# Patient Record
Sex: Female | Born: 1940 | Race: Black or African American | Marital: Married | Smoking: Current every day smoker
Health system: Southern US, Community
[De-identification: ages and names within clinical notes are randomized; demographics above are authoritative.]

## PROBLEM LIST (undated history)

## (undated) DIAGNOSIS — J439 Emphysema, unspecified: Secondary | ICD-10-CM

## (undated) HISTORY — PX: BACK SURGERY: SHX140

## (undated) HISTORY — PX: OTHER SURGICAL HISTORY: SHX169

## (undated) HISTORY — PX: TOTAL HIP ARTHROPLASTY: SHX124

## (undated) HISTORY — PX: ABDOMINAL HYSTERECTOMY: SHX81

## (undated) HISTORY — PX: SHOULDER SURGERY: SHX246

## (undated) HISTORY — PX: FOOT SURGERY: SHX648

## (undated) HISTORY — DX: Emphysema, unspecified: J43.9

---

## 2016-10-20 DIAGNOSIS — G894 Chronic pain syndrome: Secondary | ICD-10-CM | POA: Diagnosis not present

## 2016-10-20 DIAGNOSIS — M961 Postlaminectomy syndrome, not elsewhere classified: Secondary | ICD-10-CM | POA: Diagnosis not present

## 2016-10-20 DIAGNOSIS — M47817 Spondylosis without myelopathy or radiculopathy, lumbosacral region: Secondary | ICD-10-CM | POA: Diagnosis not present

## 2016-10-20 DIAGNOSIS — M79651 Pain in right thigh: Secondary | ICD-10-CM | POA: Diagnosis not present

## 2016-10-20 DIAGNOSIS — Z79899 Other long term (current) drug therapy: Secondary | ICD-10-CM | POA: Diagnosis not present

## 2016-10-20 DIAGNOSIS — Z79891 Long term (current) use of opiate analgesic: Secondary | ICD-10-CM | POA: Diagnosis not present

## 2016-11-24 DIAGNOSIS — Z79899 Other long term (current) drug therapy: Secondary | ICD-10-CM | POA: Diagnosis not present

## 2016-11-24 DIAGNOSIS — M79651 Pain in right thigh: Secondary | ICD-10-CM | POA: Diagnosis not present

## 2016-11-24 DIAGNOSIS — M961 Postlaminectomy syndrome, not elsewhere classified: Secondary | ICD-10-CM | POA: Diagnosis not present

## 2016-11-24 DIAGNOSIS — M47817 Spondylosis without myelopathy or radiculopathy, lumbosacral region: Secondary | ICD-10-CM | POA: Diagnosis not present

## 2016-11-24 DIAGNOSIS — G894 Chronic pain syndrome: Secondary | ICD-10-CM | POA: Diagnosis not present

## 2016-11-24 DIAGNOSIS — Z79891 Long term (current) use of opiate analgesic: Secondary | ICD-10-CM | POA: Diagnosis not present

## 2016-12-22 DIAGNOSIS — M961 Postlaminectomy syndrome, not elsewhere classified: Secondary | ICD-10-CM | POA: Diagnosis not present

## 2016-12-22 DIAGNOSIS — Z79899 Other long term (current) drug therapy: Secondary | ICD-10-CM | POA: Diagnosis not present

## 2016-12-22 DIAGNOSIS — G894 Chronic pain syndrome: Secondary | ICD-10-CM | POA: Diagnosis not present

## 2016-12-22 DIAGNOSIS — M545 Low back pain: Secondary | ICD-10-CM | POA: Diagnosis not present

## 2016-12-22 DIAGNOSIS — M47817 Spondylosis without myelopathy or radiculopathy, lumbosacral region: Secondary | ICD-10-CM | POA: Diagnosis not present

## 2016-12-22 DIAGNOSIS — Z79891 Long term (current) use of opiate analgesic: Secondary | ICD-10-CM | POA: Diagnosis not present

## 2016-12-30 DIAGNOSIS — M5136 Other intervertebral disc degeneration, lumbar region: Secondary | ICD-10-CM | POA: Diagnosis not present

## 2016-12-30 DIAGNOSIS — M545 Low back pain: Secondary | ICD-10-CM | POA: Diagnosis not present

## 2016-12-30 DIAGNOSIS — M961 Postlaminectomy syndrome, not elsewhere classified: Secondary | ICD-10-CM | POA: Diagnosis not present

## 2016-12-30 DIAGNOSIS — M47817 Spondylosis without myelopathy or radiculopathy, lumbosacral region: Secondary | ICD-10-CM | POA: Diagnosis not present

## 2017-01-05 DIAGNOSIS — M5136 Other intervertebral disc degeneration, lumbar region: Secondary | ICD-10-CM | POA: Diagnosis not present

## 2017-01-05 DIAGNOSIS — F5101 Primary insomnia: Secondary | ICD-10-CM | POA: Diagnosis not present

## 2017-01-05 DIAGNOSIS — G894 Chronic pain syndrome: Secondary | ICD-10-CM | POA: Diagnosis not present

## 2017-01-05 DIAGNOSIS — R1013 Epigastric pain: Secondary | ICD-10-CM | POA: Diagnosis not present

## 2017-01-05 DIAGNOSIS — Z720 Tobacco use: Secondary | ICD-10-CM | POA: Diagnosis not present

## 2017-01-18 DIAGNOSIS — M47817 Spondylosis without myelopathy or radiculopathy, lumbosacral region: Secondary | ICD-10-CM | POA: Diagnosis not present

## 2017-01-18 DIAGNOSIS — Z79899 Other long term (current) drug therapy: Secondary | ICD-10-CM | POA: Diagnosis not present

## 2017-01-18 DIAGNOSIS — Z79891 Long term (current) use of opiate analgesic: Secondary | ICD-10-CM | POA: Diagnosis not present

## 2017-01-18 DIAGNOSIS — M961 Postlaminectomy syndrome, not elsewhere classified: Secondary | ICD-10-CM | POA: Diagnosis not present

## 2017-01-18 DIAGNOSIS — G894 Chronic pain syndrome: Secondary | ICD-10-CM | POA: Diagnosis not present

## 2017-01-18 DIAGNOSIS — M79651 Pain in right thigh: Secondary | ICD-10-CM | POA: Diagnosis not present

## 2017-02-15 DIAGNOSIS — G894 Chronic pain syndrome: Secondary | ICD-10-CM | POA: Diagnosis not present

## 2017-02-15 DIAGNOSIS — M47817 Spondylosis without myelopathy or radiculopathy, lumbosacral region: Secondary | ICD-10-CM | POA: Diagnosis not present

## 2017-02-15 DIAGNOSIS — M961 Postlaminectomy syndrome, not elsewhere classified: Secondary | ICD-10-CM | POA: Diagnosis not present

## 2017-02-15 DIAGNOSIS — M79651 Pain in right thigh: Secondary | ICD-10-CM | POA: Diagnosis not present

## 2017-03-15 DIAGNOSIS — Z79891 Long term (current) use of opiate analgesic: Secondary | ICD-10-CM | POA: Diagnosis not present

## 2017-03-15 DIAGNOSIS — Z79899 Other long term (current) drug therapy: Secondary | ICD-10-CM | POA: Diagnosis not present

## 2017-03-15 DIAGNOSIS — M79651 Pain in right thigh: Secondary | ICD-10-CM | POA: Diagnosis not present

## 2017-03-15 DIAGNOSIS — M961 Postlaminectomy syndrome, not elsewhere classified: Secondary | ICD-10-CM | POA: Diagnosis not present

## 2017-03-15 DIAGNOSIS — G894 Chronic pain syndrome: Secondary | ICD-10-CM | POA: Diagnosis not present

## 2017-03-15 DIAGNOSIS — M47817 Spondylosis without myelopathy or radiculopathy, lumbosacral region: Secondary | ICD-10-CM | POA: Diagnosis not present

## 2017-04-05 DIAGNOSIS — H40003 Preglaucoma, unspecified, bilateral: Secondary | ICD-10-CM | POA: Diagnosis not present

## 2017-04-05 DIAGNOSIS — M545 Low back pain: Secondary | ICD-10-CM | POA: Diagnosis not present

## 2017-04-05 DIAGNOSIS — H527 Unspecified disorder of refraction: Secondary | ICD-10-CM | POA: Diagnosis not present

## 2017-04-05 DIAGNOSIS — H25813 Combined forms of age-related cataract, bilateral: Secondary | ICD-10-CM | POA: Diagnosis not present

## 2017-04-05 DIAGNOSIS — H02831 Dermatochalasis of right upper eyelid: Secondary | ICD-10-CM | POA: Diagnosis not present

## 2017-04-05 DIAGNOSIS — M5136 Other intervertebral disc degeneration, lumbar region: Secondary | ICD-10-CM | POA: Diagnosis not present

## 2017-04-05 DIAGNOSIS — M961 Postlaminectomy syndrome, not elsewhere classified: Secondary | ICD-10-CM | POA: Diagnosis not present

## 2017-04-05 DIAGNOSIS — H02834 Dermatochalasis of left upper eyelid: Secondary | ICD-10-CM | POA: Diagnosis not present

## 2017-04-05 DIAGNOSIS — M47817 Spondylosis without myelopathy or radiculopathy, lumbosacral region: Secondary | ICD-10-CM | POA: Diagnosis not present

## 2017-04-06 DIAGNOSIS — Z79899 Other long term (current) drug therapy: Secondary | ICD-10-CM | POA: Diagnosis not present

## 2017-04-06 DIAGNOSIS — E7800 Pure hypercholesterolemia, unspecified: Secondary | ICD-10-CM | POA: Diagnosis not present

## 2017-04-06 DIAGNOSIS — G894 Chronic pain syndrome: Secondary | ICD-10-CM | POA: Diagnosis not present

## 2017-04-06 DIAGNOSIS — Z0001 Encounter for general adult medical examination with abnormal findings: Secondary | ICD-10-CM | POA: Diagnosis not present

## 2017-04-06 DIAGNOSIS — M5136 Other intervertebral disc degeneration, lumbar region: Secondary | ICD-10-CM | POA: Diagnosis not present

## 2017-04-06 DIAGNOSIS — F5101 Primary insomnia: Secondary | ICD-10-CM | POA: Diagnosis not present

## 2017-04-06 DIAGNOSIS — Z720 Tobacco use: Secondary | ICD-10-CM | POA: Diagnosis not present

## 2017-04-12 ENCOUNTER — Other Ambulatory Visit: Payer: Self-pay | Admitting: Acute Care

## 2017-04-12 DIAGNOSIS — F17210 Nicotine dependence, cigarettes, uncomplicated: Secondary | ICD-10-CM

## 2017-04-14 ENCOUNTER — Ambulatory Visit (INDEPENDENT_AMBULATORY_CARE_PROVIDER_SITE_OTHER): Payer: Medicare Other | Admitting: Acute Care

## 2017-04-14 ENCOUNTER — Ambulatory Visit (INDEPENDENT_AMBULATORY_CARE_PROVIDER_SITE_OTHER)
Admission: RE | Admit: 2017-04-14 | Discharge: 2017-04-14 | Disposition: A | Discharge status: Home / Self Care | Payer: Medicare Other | Source: Ambulatory Visit | Attending: Acute Care | Admitting: Acute Care

## 2017-04-14 ENCOUNTER — Encounter: Payer: Self-pay | Admitting: Acute Care

## 2017-04-14 DIAGNOSIS — F17210 Nicotine dependence, cigarettes, uncomplicated: Secondary | ICD-10-CM

## 2017-04-14 DIAGNOSIS — Z87891 Personal history of nicotine dependence: Secondary | ICD-10-CM

## 2017-04-14 NOTE — Progress Notes (Signed)
Shared Decision Making Visit Lung Cancer Screening Program [phone])   Eligibility:  Age 76 y.o.  Pack Years Smoking History Calculation 37 pack year smoking history (# packs/per year x # years smoked)  Recent History of coughing up blood  no  Unexplained weight loss? no ( >Than 15 pounds within the last 6 months )  Prior History Lung / other cancer no (Diagnosis within the last 5 years already requiring surveillance chest CT Scans).  Smoking Status Current Smoker  Former Smokers: Years since quit: NA  Quit Date: NA  Visit Components:  Discussion included one or more decision making aids. yes  Discussion included risk/benefits of screening. yes  Discussion included potential follow up diagnostic testing for abnormal scans. yes  Discussion included meaning and risk of over diagnosis. yes  Discussion included meaning and risk of False Positives. yes  Discussion included meaning of total radiation exposure. yes  Counseling Included:  Importance of adherence to annual lung cancer LDCT screening. yes  Impact of comorbidities on ability to participate in the program. yes  Ability and willingness to under diagnostic treatment. yes  Smoking Cessation Counseling:  Current Smokers:   Discussed importance of smoking cessation. yes  Information about tobacco cessation classes and interventions provided to patient. yes  Patient provided with "ticket" for LDCT Scan. yes  Symptomatic Patient. no  Counseling  Diagnosis Code: Tobacco Use Z72.0  Asymptomatic Patient yes  Counseling (Intermediate counseling: > three minutes counseling) D4081  Former Smokers:   Discussed the importance of maintaining cigarette abstinence. yes  Diagnosis Code: Personal History of Nicotine Dependence. K48.185  Information about tobacco cessation classes and interventions provided to patient. Yes  Patient provided with "ticket" for LDCT Scan. yes  Written Order for Lung Cancer  Screening with LDCT placed in Epic. Yes (CT Chest Lung Cancer Screening Low Dose W/O CM) [id number] Z12.2-Screening of respiratory organs Z87.891-Personal history of nicotine dependence  I have spent 25 minutes of face to face time with Danielle Wright discussing the risks and benefits of lung cancer screening. We viewed a power point together that explained in detail the above noted topics. We paused at intervals to allow for questions to be asked and answered to ensure understanding.We discussed that the single most powerful action that she can take to decrease her risk of developing lung cancer is to quit smoking. We discussed whether or not she is ready to commit to setting a quit date. She is currently not ready to set a quit date, however is very interested in working on quitting. We discussed options for tools to aid in quitting smoking including nicotine replacement therapy, non-nicotine medications, support groups, Quit Smart classes, and behavior modification. We discussed that often times setting smaller, more achievable goals, such as eliminating 1 cigarette a day for a week and then 2 cigarettes a day for a week can be helpful in slowly decreasing the number of cigarettes smoked. This allows for a sense of accomplishment as well as providing a clinical benefit. I gave her the " Be Stronger Than Your Excuses" card with contact information for community resources, classes, free nicotine replacement therapy, and access to mobile apps, text messaging, and on-line smoking cessation help. I have also given Danielle Wright my card and contact information in the event she needs to contact me. We discussed the time and location of the scan, and that either Doroteo Glassman RN or I will call with the results within 24-48 hours of receiving them. I have offered her  a copy of the power point we viewed  as a resource in the event they need reinforcement of the concepts we discussed today in the office. The patient  verbalized understanding of all of  the above and had no further questions upon leaving the office. They have my contact information in the event they have any further questions.  I spent 4 minutes counseling on smoking cessation and the health risks of continued tobacco abuse.  I explained to the patient that there has been a high incidence of coronary artery disease noted on these exams. I explained that this is a non-gated exam therefore degree or severity cannot be determined. This patient is on statin therapy. I have asked the patient to follow-up with their PCP regarding any incidental finding of coronary artery disease and management with diet or medication as their PCP  feels is clinically indicated. The patient verbalized understanding of the above and had no further questions upon completion of the visit.     Magdalen Spatz, NP 04/14/2017

## 2017-04-15 DIAGNOSIS — Z79899 Other long term (current) drug therapy: Secondary | ICD-10-CM | POA: Diagnosis not present

## 2017-04-15 DIAGNOSIS — M79651 Pain in right thigh: Secondary | ICD-10-CM | POA: Diagnosis not present

## 2017-04-15 DIAGNOSIS — M47817 Spondylosis without myelopathy or radiculopathy, lumbosacral region: Secondary | ICD-10-CM | POA: Diagnosis not present

## 2017-04-15 DIAGNOSIS — G894 Chronic pain syndrome: Secondary | ICD-10-CM | POA: Diagnosis not present

## 2017-04-15 DIAGNOSIS — M961 Postlaminectomy syndrome, not elsewhere classified: Secondary | ICD-10-CM | POA: Diagnosis not present

## 2017-04-15 DIAGNOSIS — Z79891 Long term (current) use of opiate analgesic: Secondary | ICD-10-CM | POA: Diagnosis not present

## 2017-04-19 ENCOUNTER — Other Ambulatory Visit: Payer: Self-pay | Admitting: Acute Care

## 2017-04-19 DIAGNOSIS — F17210 Nicotine dependence, cigarettes, uncomplicated: Secondary | ICD-10-CM

## 2017-04-20 ENCOUNTER — Other Ambulatory Visit: Payer: Self-pay | Admitting: Acute Care

## 2017-04-20 DIAGNOSIS — F17210 Nicotine dependence, cigarettes, uncomplicated: Secondary | ICD-10-CM

## 2017-05-13 DIAGNOSIS — M47817 Spondylosis without myelopathy or radiculopathy, lumbosacral region: Secondary | ICD-10-CM | POA: Diagnosis not present

## 2017-05-13 DIAGNOSIS — M79651 Pain in right thigh: Secondary | ICD-10-CM | POA: Diagnosis not present

## 2017-05-13 DIAGNOSIS — M961 Postlaminectomy syndrome, not elsewhere classified: Secondary | ICD-10-CM | POA: Diagnosis not present

## 2017-05-13 DIAGNOSIS — G894 Chronic pain syndrome: Secondary | ICD-10-CM | POA: Diagnosis not present

## 2017-05-24 ENCOUNTER — Other Ambulatory Visit: Payer: Self-pay | Admitting: Internal Medicine

## 2017-05-24 ENCOUNTER — Other Ambulatory Visit: Payer: Self-pay | Admitting: Family Medicine

## 2017-05-24 DIAGNOSIS — Z1231 Encounter for screening mammogram for malignant neoplasm of breast: Secondary | ICD-10-CM

## 2017-06-09 ENCOUNTER — Ambulatory Visit
Admission: RE | Admit: 2017-06-09 | Discharge: 2017-06-09 | Disposition: A | Discharge status: Home / Self Care | Payer: Medicare Other | Source: Ambulatory Visit | Attending: Family Medicine | Admitting: Family Medicine

## 2017-06-09 DIAGNOSIS — Z1231 Encounter for screening mammogram for malignant neoplasm of breast: Secondary | ICD-10-CM | POA: Diagnosis not present

## 2017-06-10 DIAGNOSIS — Z79899 Other long term (current) drug therapy: Secondary | ICD-10-CM | POA: Diagnosis not present

## 2017-06-10 DIAGNOSIS — G894 Chronic pain syndrome: Secondary | ICD-10-CM | POA: Diagnosis not present

## 2017-06-10 DIAGNOSIS — M961 Postlaminectomy syndrome, not elsewhere classified: Secondary | ICD-10-CM | POA: Diagnosis not present

## 2017-06-10 DIAGNOSIS — M47817 Spondylosis without myelopathy or radiculopathy, lumbosacral region: Secondary | ICD-10-CM | POA: Diagnosis not present

## 2017-06-10 DIAGNOSIS — M79651 Pain in right thigh: Secondary | ICD-10-CM | POA: Diagnosis not present

## 2017-06-10 DIAGNOSIS — Z79891 Long term (current) use of opiate analgesic: Secondary | ICD-10-CM | POA: Diagnosis not present

## 2017-07-05 DIAGNOSIS — H40003 Preglaucoma, unspecified, bilateral: Secondary | ICD-10-CM | POA: Diagnosis not present

## 2017-07-06 DIAGNOSIS — G894 Chronic pain syndrome: Secondary | ICD-10-CM | POA: Diagnosis not present

## 2017-07-06 DIAGNOSIS — M79651 Pain in right thigh: Secondary | ICD-10-CM | POA: Diagnosis not present

## 2017-07-06 DIAGNOSIS — Z79891 Long term (current) use of opiate analgesic: Secondary | ICD-10-CM | POA: Diagnosis not present

## 2017-07-06 DIAGNOSIS — Z79899 Other long term (current) drug therapy: Secondary | ICD-10-CM | POA: Diagnosis not present

## 2017-07-06 DIAGNOSIS — M47817 Spondylosis without myelopathy or radiculopathy, lumbosacral region: Secondary | ICD-10-CM | POA: Diagnosis not present

## 2017-07-06 DIAGNOSIS — M961 Postlaminectomy syndrome, not elsewhere classified: Secondary | ICD-10-CM | POA: Diagnosis not present

## 2017-07-13 DIAGNOSIS — M5136 Other intervertebral disc degeneration, lumbar region: Secondary | ICD-10-CM | POA: Diagnosis not present

## 2017-07-13 DIAGNOSIS — M961 Postlaminectomy syndrome, not elsewhere classified: Secondary | ICD-10-CM | POA: Diagnosis not present

## 2017-07-13 DIAGNOSIS — M47817 Spondylosis without myelopathy or radiculopathy, lumbosacral region: Secondary | ICD-10-CM | POA: Diagnosis not present

## 2017-08-03 DIAGNOSIS — M79651 Pain in right thigh: Secondary | ICD-10-CM | POA: Diagnosis not present

## 2017-08-03 DIAGNOSIS — M961 Postlaminectomy syndrome, not elsewhere classified: Secondary | ICD-10-CM | POA: Diagnosis not present

## 2017-08-03 DIAGNOSIS — M47817 Spondylosis without myelopathy or radiculopathy, lumbosacral region: Secondary | ICD-10-CM | POA: Diagnosis not present

## 2017-08-03 DIAGNOSIS — G894 Chronic pain syndrome: Secondary | ICD-10-CM | POA: Diagnosis not present

## 2017-08-17 DIAGNOSIS — M5136 Other intervertebral disc degeneration, lumbar region: Secondary | ICD-10-CM | POA: Diagnosis not present

## 2017-08-17 DIAGNOSIS — M961 Postlaminectomy syndrome, not elsewhere classified: Secondary | ICD-10-CM | POA: Diagnosis not present

## 2017-08-17 DIAGNOSIS — M47817 Spondylosis without myelopathy or radiculopathy, lumbosacral region: Secondary | ICD-10-CM | POA: Diagnosis not present

## 2017-08-17 DIAGNOSIS — G894 Chronic pain syndrome: Secondary | ICD-10-CM | POA: Diagnosis not present

## 2017-08-31 ENCOUNTER — Ambulatory Visit (INDEPENDENT_AMBULATORY_CARE_PROVIDER_SITE_OTHER): Payer: Medicare Other

## 2017-08-31 DIAGNOSIS — J439 Emphysema, unspecified: Secondary | ICD-10-CM | POA: Diagnosis not present

## 2017-08-31 DIAGNOSIS — F17210 Nicotine dependence, cigarettes, uncomplicated: Secondary | ICD-10-CM

## 2017-08-31 DIAGNOSIS — Z122 Encounter for screening for malignant neoplasm of respiratory organs: Secondary | ICD-10-CM | POA: Diagnosis not present

## 2017-09-03 DIAGNOSIS — M47817 Spondylosis without myelopathy or radiculopathy, lumbosacral region: Secondary | ICD-10-CM | POA: Diagnosis not present

## 2017-09-03 DIAGNOSIS — M961 Postlaminectomy syndrome, not elsewhere classified: Secondary | ICD-10-CM | POA: Diagnosis not present

## 2017-09-03 DIAGNOSIS — Z79891 Long term (current) use of opiate analgesic: Secondary | ICD-10-CM | POA: Diagnosis not present

## 2017-09-03 DIAGNOSIS — G894 Chronic pain syndrome: Secondary | ICD-10-CM | POA: Diagnosis not present

## 2017-09-03 DIAGNOSIS — M79651 Pain in right thigh: Secondary | ICD-10-CM | POA: Diagnosis not present

## 2017-09-03 DIAGNOSIS — Z79899 Other long term (current) drug therapy: Secondary | ICD-10-CM | POA: Diagnosis not present

## 2017-09-06 ENCOUNTER — Telehealth: Payer: Self-pay | Admitting: Acute Care

## 2017-09-06 DIAGNOSIS — F17210 Nicotine dependence, cigarettes, uncomplicated: Secondary | ICD-10-CM

## 2017-09-06 DIAGNOSIS — Z122 Encounter for screening for malignant neoplasm of respiratory organs: Secondary | ICD-10-CM

## 2017-09-06 DIAGNOSIS — H40003 Preglaucoma, unspecified, bilateral: Secondary | ICD-10-CM | POA: Diagnosis not present

## 2017-09-06 NOTE — Telephone Encounter (Signed)
Pt informed of CT results per Sarah Groce, NP.  PT verbalized understanding.  Copy sent to PCP.  Order placed for 1 yr f/u CT.  

## 2017-10-07 DIAGNOSIS — G894 Chronic pain syndrome: Secondary | ICD-10-CM | POA: Diagnosis not present

## 2017-10-07 DIAGNOSIS — M47817 Spondylosis without myelopathy or radiculopathy, lumbosacral region: Secondary | ICD-10-CM | POA: Diagnosis not present

## 2017-10-07 DIAGNOSIS — M961 Postlaminectomy syndrome, not elsewhere classified: Secondary | ICD-10-CM | POA: Diagnosis not present

## 2017-10-07 DIAGNOSIS — M79651 Pain in right thigh: Secondary | ICD-10-CM | POA: Diagnosis not present

## 2017-10-11 DIAGNOSIS — E7800 Pure hypercholesterolemia, unspecified: Secondary | ICD-10-CM | POA: Diagnosis not present

## 2017-10-11 DIAGNOSIS — Z79890 Hormone replacement therapy (postmenopausal): Secondary | ICD-10-CM | POA: Diagnosis not present

## 2017-10-11 DIAGNOSIS — I10 Essential (primary) hypertension: Secondary | ICD-10-CM | POA: Diagnosis not present

## 2017-10-11 DIAGNOSIS — Z79899 Other long term (current) drug therapy: Secondary | ICD-10-CM | POA: Diagnosis not present

## 2017-11-03 DIAGNOSIS — Z79891 Long term (current) use of opiate analgesic: Secondary | ICD-10-CM | POA: Diagnosis not present

## 2017-11-03 DIAGNOSIS — M79651 Pain in right thigh: Secondary | ICD-10-CM | POA: Diagnosis not present

## 2017-11-03 DIAGNOSIS — M961 Postlaminectomy syndrome, not elsewhere classified: Secondary | ICD-10-CM | POA: Diagnosis not present

## 2017-11-03 DIAGNOSIS — Z79899 Other long term (current) drug therapy: Secondary | ICD-10-CM | POA: Diagnosis not present

## 2017-11-03 DIAGNOSIS — G894 Chronic pain syndrome: Secondary | ICD-10-CM | POA: Diagnosis not present

## 2017-11-03 DIAGNOSIS — M47817 Spondylosis without myelopathy or radiculopathy, lumbosacral region: Secondary | ICD-10-CM | POA: Diagnosis not present

## 2017-11-11 ENCOUNTER — Ambulatory Visit (INDEPENDENT_AMBULATORY_CARE_PROVIDER_SITE_OTHER): Payer: Medicare Other | Admitting: Obstetrics and Gynecology

## 2017-11-11 ENCOUNTER — Encounter: Payer: Self-pay | Admitting: Obstetrics and Gynecology

## 2017-11-11 VITALS — BP 126/71 | HR 91 | Ht 64.0 in | Wt 143.0 lb

## 2017-11-11 DIAGNOSIS — R232 Flushing: Secondary | ICD-10-CM | POA: Diagnosis not present

## 2017-11-11 DIAGNOSIS — L74513 Primary focal hyperhidrosis, soles: Secondary | ICD-10-CM | POA: Diagnosis not present

## 2017-11-11 DIAGNOSIS — R61 Generalized hyperhidrosis: Secondary | ICD-10-CM | POA: Diagnosis not present

## 2017-11-11 NOTE — Addendum Note (Signed)
Addended by: Lyndal Rainbow on: 11/11/2017 02:26 PM   Modules accepted: Orders

## 2017-11-11 NOTE — Progress Notes (Signed)
77 yo P3 here to establish care. Patient referred from PCP due to patient being on HRT. Patient states that she has been postmenopausal for over 20 years. She denies any vaginal bleeding or pelvic pain. She was started on HRT at least 6 years ago. She reports symptoms are well controlled but she still experiences some night sweats. Patien tis currently without any complaints. Patient is not sexually active and denies any leakage of urine  Past Medical History:  Diagnosis Date  . Emphysema of lung [facility])    Past Surgical History:  Procedure Laterality Date  . ABDOMINAL HYSTERECTOMY    . BACK SURGERY    . FOOT SURGERY Right   . SHOULDER SURGERY Right   . thumb surgery Right   . TOTAL HIP ARTHROPLASTY     Family History  Problem Relation Age of Onset  . Heart attack Father   . Stroke Mother   . Breast cancer Neg Hx    Social History   Tobacco Use  . Smoking status: Current Every Day Smoker    Packs/day: 0.75    Years: 50.00    Pack years: 37.50    Types: Cigarettes  . Smokeless tobacco: Never Used  Substance Use Topics  . Alcohol use: No    Frequency: Never  . Drug use: No   ROS See pertinent in HPI  Blood pressure 126/71, pulse 91, height 5\' 4"  (1.626 m), weight 143 lb (64.9 kg). GENERAL: Well-developed, well-nourished female in no acute distress.  ABDOMEN: Soft, nontender, nondistended. No organomegaly. PELVIC: Not indicated EXTREMITIES: No cyanosis, clubbing, or edema, 2+ distal pulses.  A/P 77 yo postmenopausal on HRT - Discussed the goal of HRT is to alleviate the symptoms while being on the lowest dose for the shortest amount of time - Discussed gradually tapering off from estradiol - Will check TSH today to rule out any other etiology of night sweats - Patient with a normal mammogram in 2018 - RTC prn - Follow up with PCP for routine care and annual exam

## 2017-11-12 LAB — TSH: TSH: 2.69 mIU/L (ref 0.40–4.50)

## 2017-12-01 DIAGNOSIS — M961 Postlaminectomy syndrome, not elsewhere classified: Secondary | ICD-10-CM | POA: Diagnosis not present

## 2017-12-01 DIAGNOSIS — Z79891 Long term (current) use of opiate analgesic: Secondary | ICD-10-CM | POA: Diagnosis not present

## 2017-12-01 DIAGNOSIS — M47817 Spondylosis without myelopathy or radiculopathy, lumbosacral region: Secondary | ICD-10-CM | POA: Diagnosis not present

## 2017-12-01 DIAGNOSIS — Z79899 Other long term (current) drug therapy: Secondary | ICD-10-CM | POA: Diagnosis not present

## 2017-12-01 DIAGNOSIS — M79651 Pain in right thigh: Secondary | ICD-10-CM | POA: Diagnosis not present

## 2017-12-01 DIAGNOSIS — G894 Chronic pain syndrome: Secondary | ICD-10-CM | POA: Diagnosis not present

## 2017-12-15 DIAGNOSIS — M47896 Other spondylosis, lumbar region: Secondary | ICD-10-CM | POA: Diagnosis not present

## 2017-12-15 DIAGNOSIS — M545 Low back pain: Secondary | ICD-10-CM | POA: Diagnosis not present

## 2017-12-15 DIAGNOSIS — M5136 Other intervertebral disc degeneration, lumbar region: Secondary | ICD-10-CM | POA: Diagnosis not present

## 2017-12-15 DIAGNOSIS — M5137 Other intervertebral disc degeneration, lumbosacral region: Secondary | ICD-10-CM | POA: Diagnosis not present

## 2017-12-15 DIAGNOSIS — M47897 Other spondylosis, lumbosacral region: Secondary | ICD-10-CM | POA: Diagnosis not present

## 2017-12-24 DIAGNOSIS — M25551 Pain in right hip: Secondary | ICD-10-CM | POA: Diagnosis not present

## 2017-12-29 DIAGNOSIS — Z79891 Long term (current) use of opiate analgesic: Secondary | ICD-10-CM | POA: Diagnosis not present

## 2017-12-29 DIAGNOSIS — Z79899 Other long term (current) drug therapy: Secondary | ICD-10-CM | POA: Diagnosis not present

## 2017-12-29 DIAGNOSIS — M25551 Pain in right hip: Secondary | ICD-10-CM | POA: Diagnosis not present

## 2017-12-29 DIAGNOSIS — G894 Chronic pain syndrome: Secondary | ICD-10-CM | POA: Diagnosis not present

## 2017-12-29 DIAGNOSIS — M961 Postlaminectomy syndrome, not elsewhere classified: Secondary | ICD-10-CM | POA: Diagnosis not present

## 2018-01-03 DIAGNOSIS — G894 Chronic pain syndrome: Secondary | ICD-10-CM | POA: Diagnosis not present

## 2018-01-03 DIAGNOSIS — M25559 Pain in unspecified hip: Secondary | ICD-10-CM | POA: Diagnosis not present

## 2018-01-03 DIAGNOSIS — M79651 Pain in right thigh: Secondary | ICD-10-CM | POA: Diagnosis not present

## 2018-01-11 IMAGING — CT CT CHEST LCS NODULE FOLLOW-UP W/O CM
2 of 3 series · 15 of 36 positions shown, 18 images · non-contrast
Comparison: 04/14/2017

CLINICAL DATA: Lung cancer screening. Current asymptomatic smoker.
Thirty-seven pack year history.

EXAM:
CT CHEST WITHOUT CONTRAST FOR LUNG CANCER SCREENING NODULE FOLLOW-UP
TECHNIQUE: Multidetector CT imaging of the chest was performed following the
standard protocol without IV contrast.

[Series 2: axial st · axial · 0.67mm/px · z∈[-233,+12]mm · 12 of 59 slices shown, 15 images]
[im 5/59  mediastinal]
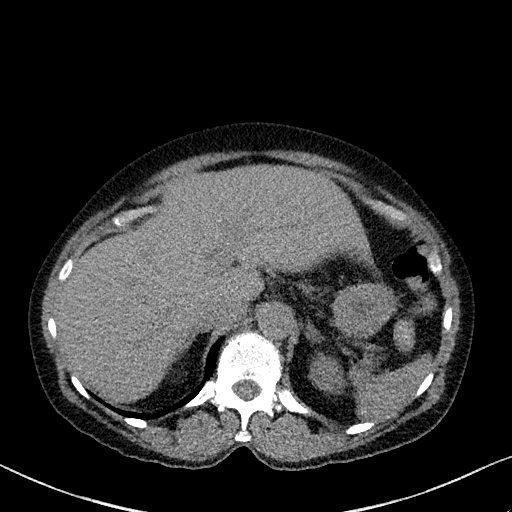
[im 5/59  lung]
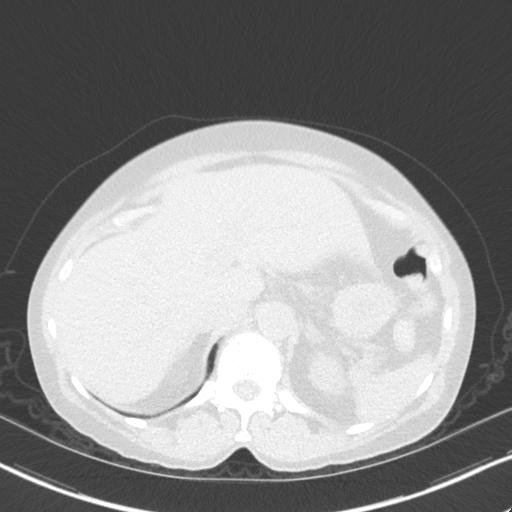
[im 9/59  lung]
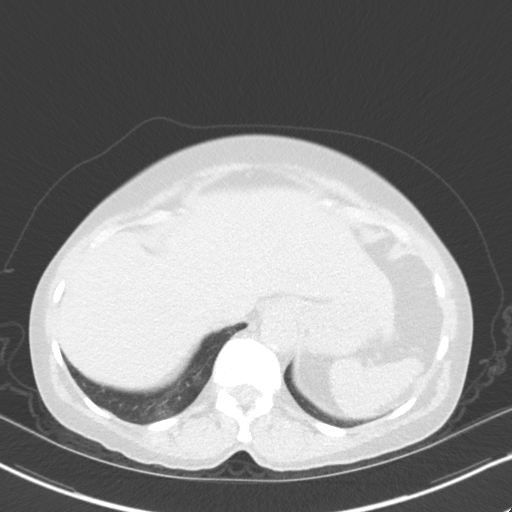
[im 13/59  lung]
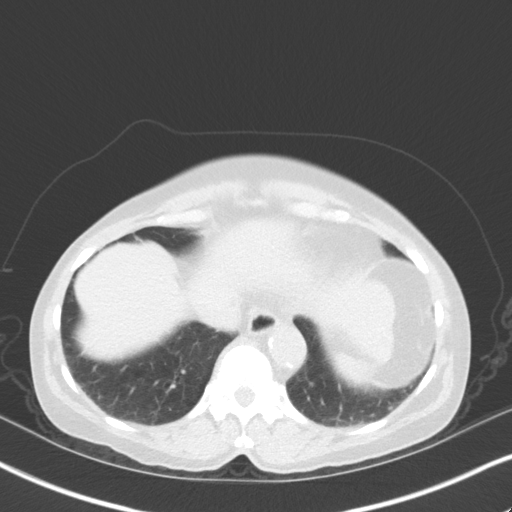
[im 18/59  lung]
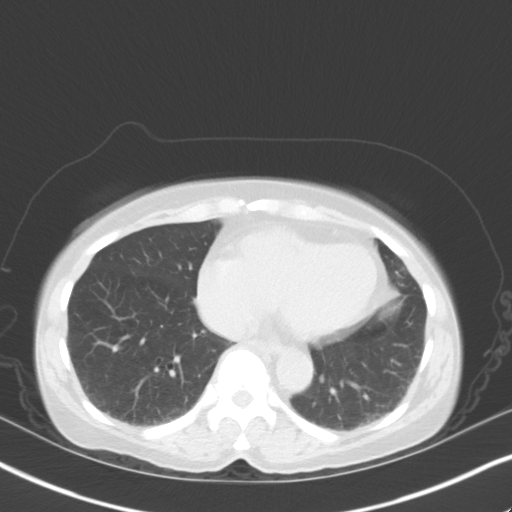
[im 22/59  mediastinal]
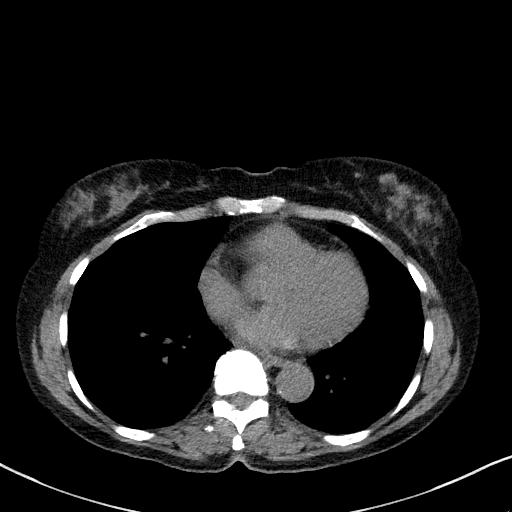
[im 22/59  lung]
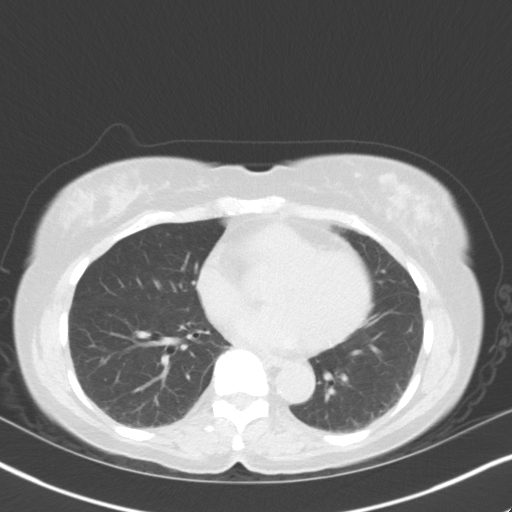
[im 26/59  lung]
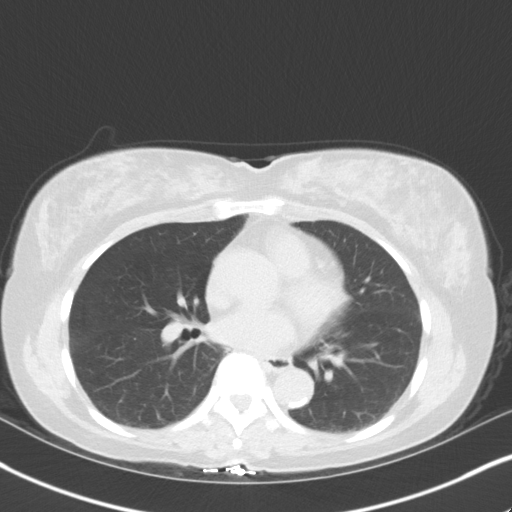
[im 33/59  lung]
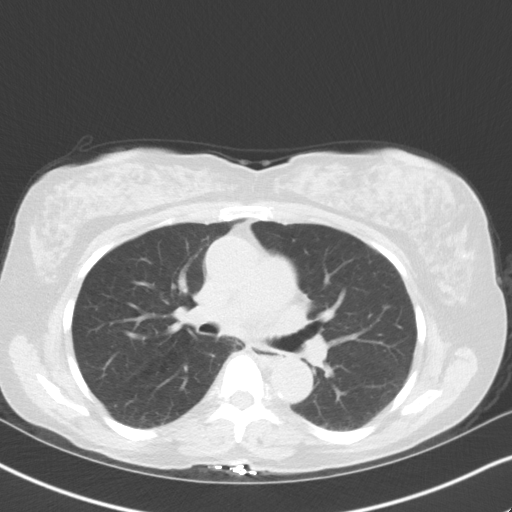
[im 37/59  lung]
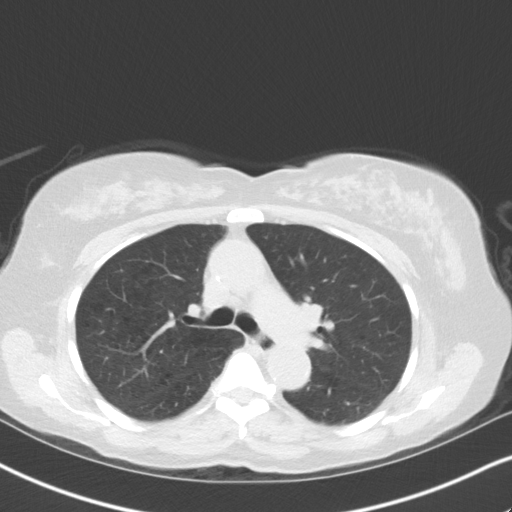
[im 41/59  mediastinal]
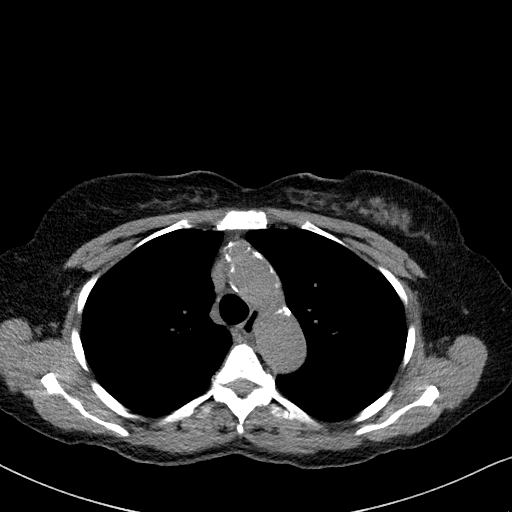
[im 41/59  lung]
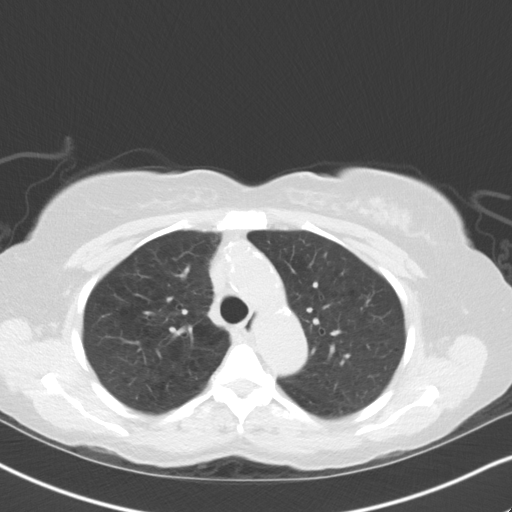
[im 46/59  lung]
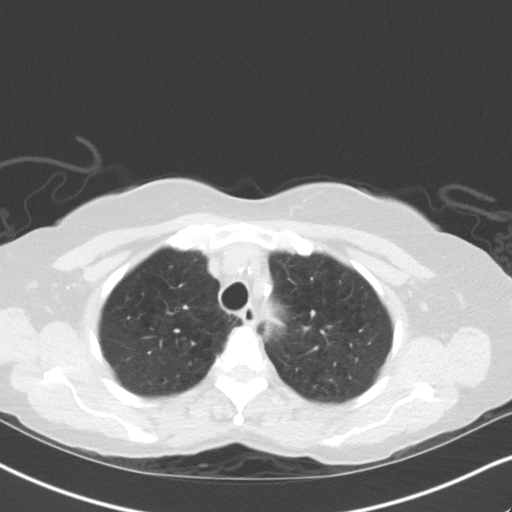
[im 50/59  lung]
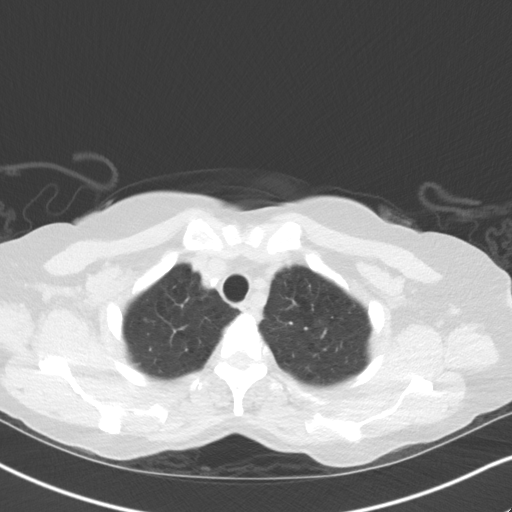
[im 54/59  lung]
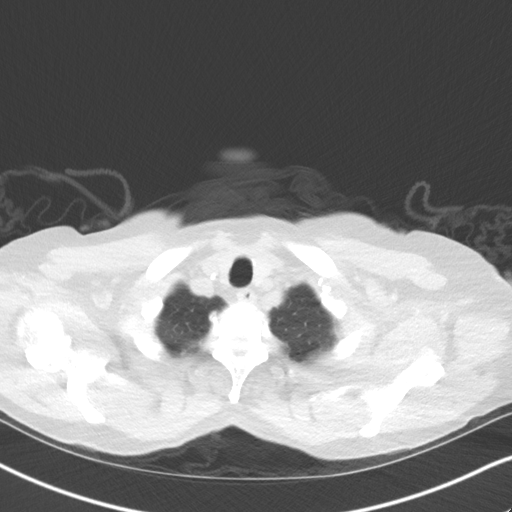

[Series 4: coronal · coronal · 0.60mm/px · 3 of 232 slices shown]
[im 47/232  lung]
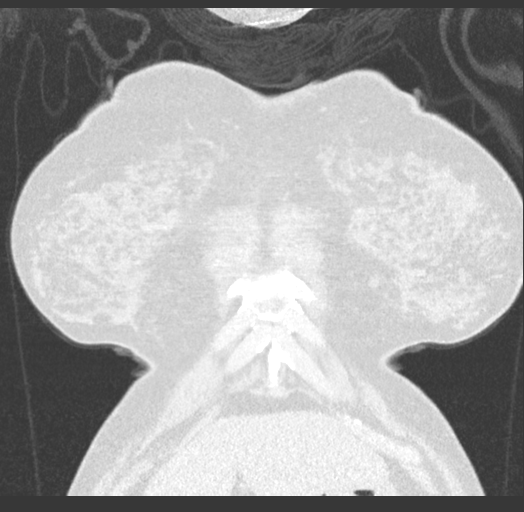
[im 93/232  lung]
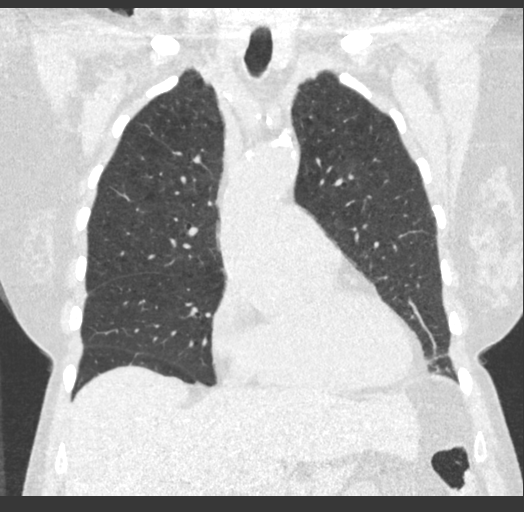
[im 139/232  lung]
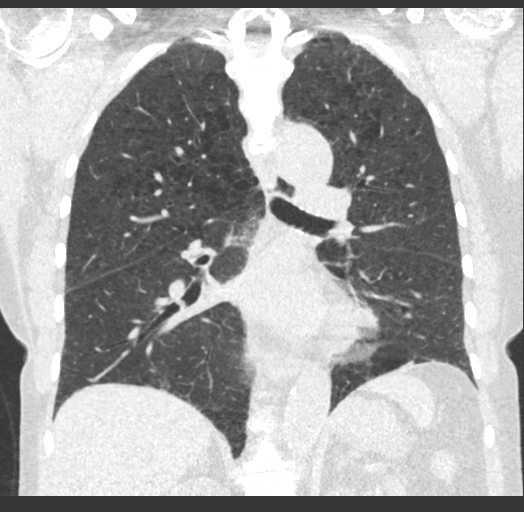

[15 of 36 positions shown; findings below may reference images not displayed]

FINDINGS: Cardiovascular: The normal heart size. No pericardial effusion
identified. Calcification within the LAD coronary artery noted. No
pericardial effusion.

Mediastinum/Nodes: No enlarged mediastinal, hilar, or axillary lymph
nodes. Thyroid gland, trachea, and esophagus demonstrate no
significant findings.

Lungs/Pleura: Moderate change of centrilobular emphysema. Small
pulmonary nodules are identified in both lungs. The largest is in
the posterior right lower lobe with an equivalent diameter of
mm. This is decreased in size from 10.8 mm previously.

Upper Abdomen: No acute abnormality.

Musculoskeletal: No chest wall mass or suspicious bone lesions
identified.
IMPRESSION: 1. Lung-RADS 2, benign appearance or behavior. Continue annual
screening with low-dose chest CT without contrast in 12 months.
2. Aortic Atherosclerosis (E5PF2-YKZ.[id number]) and Emphysema (E5PF2-7OA.6).
Lad coronary artery calcification.

## 2018-01-25 DIAGNOSIS — G894 Chronic pain syndrome: Secondary | ICD-10-CM | POA: Diagnosis not present

## 2018-01-25 DIAGNOSIS — M5136 Other intervertebral disc degeneration, lumbar region: Secondary | ICD-10-CM | POA: Diagnosis not present

## 2018-01-25 DIAGNOSIS — J209 Acute bronchitis, unspecified: Secondary | ICD-10-CM | POA: Diagnosis not present

## 2018-01-26 DIAGNOSIS — M961 Postlaminectomy syndrome, not elsewhere classified: Secondary | ICD-10-CM | POA: Diagnosis not present

## 2018-01-26 DIAGNOSIS — M25559 Pain in unspecified hip: Secondary | ICD-10-CM | POA: Diagnosis not present

## 2018-01-26 DIAGNOSIS — G894 Chronic pain syndrome: Secondary | ICD-10-CM | POA: Diagnosis not present

## 2018-01-26 DIAGNOSIS — M47817 Spondylosis without myelopathy or radiculopathy, lumbosacral region: Secondary | ICD-10-CM | POA: Diagnosis not present

## 2018-02-23 DIAGNOSIS — G5700 Lesion of sciatic nerve, unspecified lower limb: Secondary | ICD-10-CM | POA: Diagnosis not present

## 2018-02-23 DIAGNOSIS — M5136 Other intervertebral disc degeneration, lumbar region: Secondary | ICD-10-CM | POA: Diagnosis not present

## 2018-02-23 DIAGNOSIS — M461 Sacroiliitis, not elsewhere classified: Secondary | ICD-10-CM | POA: Diagnosis not present

## 2018-02-23 DIAGNOSIS — M961 Postlaminectomy syndrome, not elsewhere classified: Secondary | ICD-10-CM | POA: Diagnosis not present

## 2018-02-23 DIAGNOSIS — G894 Chronic pain syndrome: Secondary | ICD-10-CM | POA: Diagnosis not present

## 2018-02-23 DIAGNOSIS — Z79891 Long term (current) use of opiate analgesic: Secondary | ICD-10-CM | POA: Diagnosis not present

## 2018-02-23 DIAGNOSIS — M47816 Spondylosis without myelopathy or radiculopathy, lumbar region: Secondary | ICD-10-CM | POA: Diagnosis not present

## 2018-03-01 DIAGNOSIS — M461 Sacroiliitis, not elsewhere classified: Secondary | ICD-10-CM | POA: Diagnosis not present

## 2018-03-01 DIAGNOSIS — M47817 Spondylosis without myelopathy or radiculopathy, lumbosacral region: Secondary | ICD-10-CM | POA: Diagnosis not present

## 2018-03-02 DIAGNOSIS — G894 Chronic pain syndrome: Secondary | ICD-10-CM | POA: Diagnosis not present

## 2018-03-14 DIAGNOSIS — G894 Chronic pain syndrome: Secondary | ICD-10-CM | POA: Diagnosis not present

## 2018-03-16 DIAGNOSIS — G603 Idiopathic progressive neuropathy: Secondary | ICD-10-CM | POA: Diagnosis not present

## 2018-03-16 DIAGNOSIS — M5417 Radiculopathy, lumbosacral region: Secondary | ICD-10-CM | POA: Diagnosis not present

## 2018-03-17 DIAGNOSIS — Z79891 Long term (current) use of opiate analgesic: Secondary | ICD-10-CM | POA: Diagnosis not present

## 2018-03-17 DIAGNOSIS — M961 Postlaminectomy syndrome, not elsewhere classified: Secondary | ICD-10-CM | POA: Diagnosis not present

## 2018-03-17 DIAGNOSIS — M5136 Other intervertebral disc degeneration, lumbar region: Secondary | ICD-10-CM | POA: Diagnosis not present

## 2018-03-17 DIAGNOSIS — M461 Sacroiliitis, not elsewhere classified: Secondary | ICD-10-CM | POA: Diagnosis not present

## 2018-03-17 DIAGNOSIS — M47816 Spondylosis without myelopathy or radiculopathy, lumbar region: Secondary | ICD-10-CM | POA: Diagnosis not present

## 2018-03-17 DIAGNOSIS — G5700 Lesion of sciatic nerve, unspecified lower limb: Secondary | ICD-10-CM | POA: Diagnosis not present

## 2018-03-17 DIAGNOSIS — G894 Chronic pain syndrome: Secondary | ICD-10-CM | POA: Diagnosis not present

## 2018-03-24 DIAGNOSIS — G894 Chronic pain syndrome: Secondary | ICD-10-CM | POA: Diagnosis not present

## 2018-04-04 DIAGNOSIS — G894 Chronic pain syndrome: Secondary | ICD-10-CM | POA: Diagnosis not present

## 2018-04-06 DIAGNOSIS — G894 Chronic pain syndrome: Secondary | ICD-10-CM | POA: Diagnosis not present

## 2018-04-11 ENCOUNTER — Other Ambulatory Visit: Payer: Self-pay | Admitting: Family Medicine

## 2018-04-11 ENCOUNTER — Inpatient Hospital Stay
Admission: RE | Admit: 2018-04-11 | Discharge: 2018-04-11 | Disposition: A | Discharge status: Home / Self Care | Payer: Medicare Other | Source: Ambulatory Visit | Attending: Family Medicine | Admitting: Family Medicine

## 2018-04-11 DIAGNOSIS — R911 Solitary pulmonary nodule: Secondary | ICD-10-CM

## 2018-04-11 DIAGNOSIS — Z1159 Encounter for screening for other viral diseases: Secondary | ICD-10-CM | POA: Diagnosis not present

## 2018-04-11 DIAGNOSIS — F5101 Primary insomnia: Secondary | ICD-10-CM | POA: Diagnosis not present

## 2018-04-11 DIAGNOSIS — Z79899 Other long term (current) drug therapy: Secondary | ICD-10-CM | POA: Diagnosis not present

## 2018-04-11 DIAGNOSIS — M5136 Other intervertebral disc degeneration, lumbar region: Secondary | ICD-10-CM | POA: Diagnosis not present

## 2018-04-11 DIAGNOSIS — G894 Chronic pain syndrome: Secondary | ICD-10-CM | POA: Diagnosis not present

## 2018-04-11 DIAGNOSIS — E7800 Pure hypercholesterolemia, unspecified: Secondary | ICD-10-CM | POA: Diagnosis not present

## 2018-04-11 DIAGNOSIS — Z136 Encounter for screening for cardiovascular disorders: Secondary | ICD-10-CM | POA: Diagnosis not present

## 2018-04-11 DIAGNOSIS — N951 Menopausal and female climacteric states: Secondary | ICD-10-CM | POA: Diagnosis not present

## 2018-04-11 DIAGNOSIS — Z23 Encounter for immunization: Secondary | ICD-10-CM | POA: Diagnosis not present

## 2018-04-11 DIAGNOSIS — Z0001 Encounter for general adult medical examination with abnormal findings: Secondary | ICD-10-CM | POA: Diagnosis not present

## 2018-04-11 DIAGNOSIS — I10 Essential (primary) hypertension: Secondary | ICD-10-CM | POA: Diagnosis not present

## 2018-04-12 DIAGNOSIS — M5136 Other intervertebral disc degeneration, lumbar region: Secondary | ICD-10-CM | POA: Diagnosis not present

## 2018-04-12 DIAGNOSIS — C3490 Malignant neoplasm of unspecified part of unspecified bronchus or lung: Secondary | ICD-10-CM | POA: Diagnosis not present

## 2018-04-12 DIAGNOSIS — R911 Solitary pulmonary nodule: Secondary | ICD-10-CM | POA: Diagnosis not present

## 2018-04-20 DIAGNOSIS — G894 Chronic pain syndrome: Secondary | ICD-10-CM | POA: Diagnosis not present

## 2018-04-22 DIAGNOSIS — G894 Chronic pain syndrome: Secondary | ICD-10-CM | POA: Diagnosis not present

## 2018-04-28 DIAGNOSIS — G894 Chronic pain syndrome: Secondary | ICD-10-CM | POA: Diagnosis not present

## 2018-05-03 DIAGNOSIS — G894 Chronic pain syndrome: Secondary | ICD-10-CM | POA: Diagnosis not present

## 2018-05-09 DIAGNOSIS — G894 Chronic pain syndrome: Secondary | ICD-10-CM | POA: Diagnosis not present

## 2018-05-30 DIAGNOSIS — M5136 Other intervertebral disc degeneration, lumbar region: Secondary | ICD-10-CM | POA: Diagnosis not present

## 2018-05-30 DIAGNOSIS — M47816 Spondylosis without myelopathy or radiculopathy, lumbar region: Secondary | ICD-10-CM | POA: Diagnosis not present

## 2018-05-30 DIAGNOSIS — G894 Chronic pain syndrome: Secondary | ICD-10-CM | POA: Diagnosis not present

## 2018-05-30 DIAGNOSIS — G5700 Lesion of sciatic nerve, unspecified lower limb: Secondary | ICD-10-CM | POA: Diagnosis not present

## 2018-05-30 DIAGNOSIS — Z79891 Long term (current) use of opiate analgesic: Secondary | ICD-10-CM | POA: Diagnosis not present

## 2018-05-30 DIAGNOSIS — M461 Sacroiliitis, not elsewhere classified: Secondary | ICD-10-CM | POA: Diagnosis not present

## 2018-05-30 DIAGNOSIS — M961 Postlaminectomy syndrome, not elsewhere classified: Secondary | ICD-10-CM | POA: Diagnosis not present

## 2018-07-11 DIAGNOSIS — M461 Sacroiliitis, not elsewhere classified: Secondary | ICD-10-CM | POA: Diagnosis not present

## 2018-07-11 DIAGNOSIS — M5136 Other intervertebral disc degeneration, lumbar region: Secondary | ICD-10-CM | POA: Diagnosis not present

## 2018-07-11 DIAGNOSIS — Z79891 Long term (current) use of opiate analgesic: Secondary | ICD-10-CM | POA: Diagnosis not present

## 2018-07-11 DIAGNOSIS — M961 Postlaminectomy syndrome, not elsewhere classified: Secondary | ICD-10-CM | POA: Diagnosis not present

## 2018-07-11 DIAGNOSIS — G5700 Lesion of sciatic nerve, unspecified lower limb: Secondary | ICD-10-CM | POA: Diagnosis not present

## 2018-07-11 DIAGNOSIS — M47816 Spondylosis without myelopathy or radiculopathy, lumbar region: Secondary | ICD-10-CM | POA: Diagnosis not present

## 2018-07-11 DIAGNOSIS — G894 Chronic pain syndrome: Secondary | ICD-10-CM | POA: Diagnosis not present

## 2018-07-20 DIAGNOSIS — M85852 Other specified disorders of bone density and structure, left thigh: Secondary | ICD-10-CM | POA: Diagnosis not present

## 2018-07-20 DIAGNOSIS — E2839 Other primary ovarian failure: Secondary | ICD-10-CM | POA: Diagnosis not present

## 2018-08-29 DIAGNOSIS — M461 Sacroiliitis, not elsewhere classified: Secondary | ICD-10-CM | POA: Diagnosis not present

## 2018-08-29 DIAGNOSIS — M5136 Other intervertebral disc degeneration, lumbar region: Secondary | ICD-10-CM | POA: Diagnosis not present

## 2018-08-29 DIAGNOSIS — Z79891 Long term (current) use of opiate analgesic: Secondary | ICD-10-CM | POA: Diagnosis not present

## 2018-08-29 DIAGNOSIS — G5700 Lesion of sciatic nerve, unspecified lower limb: Secondary | ICD-10-CM | POA: Diagnosis not present

## 2018-08-29 DIAGNOSIS — G894 Chronic pain syndrome: Secondary | ICD-10-CM | POA: Diagnosis not present

## 2018-08-29 DIAGNOSIS — M47816 Spondylosis without myelopathy or radiculopathy, lumbar region: Secondary | ICD-10-CM | POA: Diagnosis not present

## 2018-08-29 DIAGNOSIS — M961 Postlaminectomy syndrome, not elsewhere classified: Secondary | ICD-10-CM | POA: Diagnosis not present

## 2018-09-05 ENCOUNTER — Ambulatory Visit: Payer: Medicare Other

## 2018-10-24 DIAGNOSIS — G894 Chronic pain syndrome: Secondary | ICD-10-CM | POA: Diagnosis not present

## 2018-10-24 DIAGNOSIS — M5136 Other intervertebral disc degeneration, lumbar region: Secondary | ICD-10-CM | POA: Diagnosis not present

## 2018-10-24 DIAGNOSIS — Z79890 Hormone replacement therapy (postmenopausal): Secondary | ICD-10-CM | POA: Diagnosis not present

## 2018-10-24 DIAGNOSIS — L819 Disorder of pigmentation, unspecified: Secondary | ICD-10-CM | POA: Diagnosis not present

## 2018-10-24 DIAGNOSIS — R911 Solitary pulmonary nodule: Secondary | ICD-10-CM | POA: Diagnosis not present

## 2018-11-03 DIAGNOSIS — Z1231 Encounter for screening mammogram for malignant neoplasm of breast: Secondary | ICD-10-CM | POA: Diagnosis not present

## 2018-11-14 DIAGNOSIS — M961 Postlaminectomy syndrome, not elsewhere classified: Secondary | ICD-10-CM | POA: Diagnosis not present

## 2018-11-14 DIAGNOSIS — G5700 Lesion of sciatic nerve, unspecified lower limb: Secondary | ICD-10-CM | POA: Diagnosis not present

## 2018-11-14 DIAGNOSIS — M5136 Other intervertebral disc degeneration, lumbar region: Secondary | ICD-10-CM | POA: Diagnosis not present

## 2018-11-14 DIAGNOSIS — Z79891 Long term (current) use of opiate analgesic: Secondary | ICD-10-CM | POA: Diagnosis not present

## 2018-11-14 DIAGNOSIS — G894 Chronic pain syndrome: Secondary | ICD-10-CM | POA: Diagnosis not present

## 2018-11-14 DIAGNOSIS — M47816 Spondylosis without myelopathy or radiculopathy, lumbar region: Secondary | ICD-10-CM | POA: Diagnosis not present

## 2018-11-14 DIAGNOSIS — M461 Sacroiliitis, not elsewhere classified: Secondary | ICD-10-CM | POA: Diagnosis not present

## 2018-11-18 DIAGNOSIS — B360 Pityriasis versicolor: Secondary | ICD-10-CM | POA: Diagnosis not present

## 2018-11-28 DIAGNOSIS — L80 Vitiligo: Secondary | ICD-10-CM | POA: Diagnosis not present

## 2018-12-14 DIAGNOSIS — M5136 Other intervertebral disc degeneration, lumbar region: Secondary | ICD-10-CM | POA: Diagnosis not present

## 2018-12-14 DIAGNOSIS — Z79891 Long term (current) use of opiate analgesic: Secondary | ICD-10-CM | POA: Diagnosis not present

## 2018-12-14 DIAGNOSIS — M961 Postlaminectomy syndrome, not elsewhere classified: Secondary | ICD-10-CM | POA: Diagnosis not present

## 2018-12-14 DIAGNOSIS — M461 Sacroiliitis, not elsewhere classified: Secondary | ICD-10-CM | POA: Diagnosis not present

## 2018-12-14 DIAGNOSIS — M47816 Spondylosis without myelopathy or radiculopathy, lumbar region: Secondary | ICD-10-CM | POA: Diagnosis not present

## 2018-12-14 DIAGNOSIS — G894 Chronic pain syndrome: Secondary | ICD-10-CM | POA: Diagnosis not present

## 2018-12-14 DIAGNOSIS — G5700 Lesion of sciatic nerve, unspecified lower limb: Secondary | ICD-10-CM | POA: Diagnosis not present

## 2019-01-31 DIAGNOSIS — G5700 Lesion of sciatic nerve, unspecified lower limb: Secondary | ICD-10-CM | POA: Diagnosis not present

## 2019-01-31 DIAGNOSIS — M47816 Spondylosis without myelopathy or radiculopathy, lumbar region: Secondary | ICD-10-CM | POA: Diagnosis not present

## 2019-01-31 DIAGNOSIS — M461 Sacroiliitis, not elsewhere classified: Secondary | ICD-10-CM | POA: Diagnosis not present

## 2019-01-31 DIAGNOSIS — M961 Postlaminectomy syndrome, not elsewhere classified: Secondary | ICD-10-CM | POA: Diagnosis not present

## 2019-01-31 DIAGNOSIS — G894 Chronic pain syndrome: Secondary | ICD-10-CM | POA: Diagnosis not present

## 2019-01-31 DIAGNOSIS — M5136 Other intervertebral disc degeneration, lumbar region: Secondary | ICD-10-CM | POA: Diagnosis not present

## 2019-02-01 DIAGNOSIS — L818 Other specified disorders of pigmentation: Secondary | ICD-10-CM | POA: Diagnosis not present

## 2019-04-10 DIAGNOSIS — Z7189 Other specified counseling: Secondary | ICD-10-CM | POA: Diagnosis not present

## 2019-04-10 DIAGNOSIS — M5136 Other intervertebral disc degeneration, lumbar region: Secondary | ICD-10-CM | POA: Diagnosis not present

## 2019-04-10 DIAGNOSIS — L819 Disorder of pigmentation, unspecified: Secondary | ICD-10-CM | POA: Diagnosis not present

## 2019-04-10 DIAGNOSIS — G4700 Insomnia, unspecified: Secondary | ICD-10-CM | POA: Diagnosis not present

## 2019-04-19 DIAGNOSIS — L80 Vitiligo: Secondary | ICD-10-CM | POA: Diagnosis not present

## 2019-04-19 DIAGNOSIS — L818 Other specified disorders of pigmentation: Secondary | ICD-10-CM | POA: Diagnosis not present

## 2019-04-19 DIAGNOSIS — Z79899 Other long term (current) drug therapy: Secondary | ICD-10-CM | POA: Diagnosis not present

## 2019-04-24 DIAGNOSIS — G4700 Insomnia, unspecified: Secondary | ICD-10-CM | POA: Diagnosis not present

## 2019-04-24 DIAGNOSIS — Z79899 Other long term (current) drug therapy: Secondary | ICD-10-CM | POA: Diagnosis not present

## 2019-04-24 DIAGNOSIS — R7309 Other abnormal glucose: Secondary | ICD-10-CM | POA: Diagnosis not present

## 2019-04-24 DIAGNOSIS — M5136 Other intervertebral disc degeneration, lumbar region: Secondary | ICD-10-CM | POA: Diagnosis not present

## 2019-04-24 DIAGNOSIS — R946 Abnormal results of thyroid function studies: Secondary | ICD-10-CM | POA: Diagnosis not present

## 2019-04-26 DIAGNOSIS — G5700 Lesion of sciatic nerve, unspecified lower limb: Secondary | ICD-10-CM | POA: Diagnosis not present

## 2019-04-26 DIAGNOSIS — M961 Postlaminectomy syndrome, not elsewhere classified: Secondary | ICD-10-CM | POA: Diagnosis not present

## 2019-04-26 DIAGNOSIS — M5136 Other intervertebral disc degeneration, lumbar region: Secondary | ICD-10-CM | POA: Diagnosis not present

## 2019-04-26 DIAGNOSIS — M461 Sacroiliitis, not elsewhere classified: Secondary | ICD-10-CM | POA: Diagnosis not present

## 2019-04-26 DIAGNOSIS — Z79891 Long term (current) use of opiate analgesic: Secondary | ICD-10-CM | POA: Diagnosis not present

## 2019-04-26 DIAGNOSIS — M47816 Spondylosis without myelopathy or radiculopathy, lumbar region: Secondary | ICD-10-CM | POA: Diagnosis not present

## 2019-04-26 DIAGNOSIS — G894 Chronic pain syndrome: Secondary | ICD-10-CM | POA: Diagnosis not present

## 2019-05-02 DIAGNOSIS — L80 Vitiligo: Secondary | ICD-10-CM | POA: Diagnosis not present

## 2019-05-04 DIAGNOSIS — L80 Vitiligo: Secondary | ICD-10-CM | POA: Diagnosis not present

## 2019-05-09 DIAGNOSIS — L80 Vitiligo: Secondary | ICD-10-CM | POA: Diagnosis not present

## 2019-05-11 DIAGNOSIS — L80 Vitiligo: Secondary | ICD-10-CM | POA: Diagnosis not present

## 2019-05-16 DIAGNOSIS — L80 Vitiligo: Secondary | ICD-10-CM | POA: Diagnosis not present

## 2019-05-23 DIAGNOSIS — L80 Vitiligo: Secondary | ICD-10-CM | POA: Diagnosis not present

## 2019-05-25 DIAGNOSIS — L80 Vitiligo: Secondary | ICD-10-CM | POA: Diagnosis not present

## 2019-05-30 DIAGNOSIS — L80 Vitiligo: Secondary | ICD-10-CM | POA: Diagnosis not present

## 2019-06-01 DIAGNOSIS — L80 Vitiligo: Secondary | ICD-10-CM | POA: Diagnosis not present

## 2019-06-06 DIAGNOSIS — L80 Vitiligo: Secondary | ICD-10-CM | POA: Diagnosis not present

## 2019-06-19 DIAGNOSIS — G894 Chronic pain syndrome: Secondary | ICD-10-CM | POA: Diagnosis not present

## 2019-06-19 DIAGNOSIS — M47816 Spondylosis without myelopathy or radiculopathy, lumbar region: Secondary | ICD-10-CM | POA: Diagnosis not present

## 2019-06-19 DIAGNOSIS — M961 Postlaminectomy syndrome, not elsewhere classified: Secondary | ICD-10-CM | POA: Diagnosis not present

## 2019-06-19 DIAGNOSIS — M5136 Other intervertebral disc degeneration, lumbar region: Secondary | ICD-10-CM | POA: Diagnosis not present

## 2019-06-19 DIAGNOSIS — G5700 Lesion of sciatic nerve, unspecified lower limb: Secondary | ICD-10-CM | POA: Diagnosis not present

## 2019-06-19 DIAGNOSIS — M461 Sacroiliitis, not elsewhere classified: Secondary | ICD-10-CM | POA: Diagnosis not present

## 2019-06-19 DIAGNOSIS — Z79891 Long term (current) use of opiate analgesic: Secondary | ICD-10-CM | POA: Diagnosis not present

## 2019-07-26 DIAGNOSIS — Z79899 Other long term (current) drug therapy: Secondary | ICD-10-CM | POA: Diagnosis not present

## 2019-07-26 DIAGNOSIS — L80 Vitiligo: Secondary | ICD-10-CM | POA: Diagnosis not present

## 2019-08-23 DIAGNOSIS — M5136 Other intervertebral disc degeneration, lumbar region: Secondary | ICD-10-CM | POA: Diagnosis not present

## 2019-08-23 DIAGNOSIS — Z79891 Long term (current) use of opiate analgesic: Secondary | ICD-10-CM | POA: Diagnosis not present

## 2019-08-23 DIAGNOSIS — M461 Sacroiliitis, not elsewhere classified: Secondary | ICD-10-CM | POA: Diagnosis not present

## 2019-08-23 DIAGNOSIS — M961 Postlaminectomy syndrome, not elsewhere classified: Secondary | ICD-10-CM | POA: Diagnosis not present

## 2019-08-23 DIAGNOSIS — G894 Chronic pain syndrome: Secondary | ICD-10-CM | POA: Diagnosis not present

## 2019-08-23 DIAGNOSIS — M47816 Spondylosis without myelopathy or radiculopathy, lumbar region: Secondary | ICD-10-CM | POA: Diagnosis not present

## 2019-08-23 DIAGNOSIS — G5700 Lesion of sciatic nerve, unspecified lower limb: Secondary | ICD-10-CM | POA: Diagnosis not present

## 2019-10-23 DIAGNOSIS — M461 Sacroiliitis, not elsewhere classified: Secondary | ICD-10-CM | POA: Diagnosis not present

## 2019-10-23 DIAGNOSIS — M47816 Spondylosis without myelopathy or radiculopathy, lumbar region: Secondary | ICD-10-CM | POA: Diagnosis not present

## 2019-10-23 DIAGNOSIS — G5700 Lesion of sciatic nerve, unspecified lower limb: Secondary | ICD-10-CM | POA: Diagnosis not present

## 2019-10-23 DIAGNOSIS — G894 Chronic pain syndrome: Secondary | ICD-10-CM | POA: Diagnosis not present

## 2019-10-23 DIAGNOSIS — M5136 Other intervertebral disc degeneration, lumbar region: Secondary | ICD-10-CM | POA: Diagnosis not present

## 2019-10-23 DIAGNOSIS — M961 Postlaminectomy syndrome, not elsewhere classified: Secondary | ICD-10-CM | POA: Diagnosis not present

## 2019-10-23 DIAGNOSIS — Z79891 Long term (current) use of opiate analgesic: Secondary | ICD-10-CM | POA: Diagnosis not present

## 2019-10-26 DIAGNOSIS — R7309 Other abnormal glucose: Secondary | ICD-10-CM | POA: Diagnosis not present

## 2019-10-26 DIAGNOSIS — Z79899 Other long term (current) drug therapy: Secondary | ICD-10-CM | POA: Diagnosis not present

## 2019-11-02 DIAGNOSIS — M1990 Unspecified osteoarthritis, unspecified site: Secondary | ICD-10-CM | POA: Diagnosis not present

## 2019-11-02 DIAGNOSIS — Z13220 Encounter for screening for lipoid disorders: Secondary | ICD-10-CM | POA: Diagnosis not present

## 2019-11-02 DIAGNOSIS — D582 Other hemoglobinopathies: Secondary | ICD-10-CM | POA: Diagnosis not present

## 2019-11-02 DIAGNOSIS — G4700 Insomnia, unspecified: Secondary | ICD-10-CM | POA: Diagnosis not present

## 2019-11-02 DIAGNOSIS — M5136 Other intervertebral disc degeneration, lumbar region: Secondary | ICD-10-CM | POA: Diagnosis not present

## 2019-11-02 DIAGNOSIS — F17200 Nicotine dependence, unspecified, uncomplicated: Secondary | ICD-10-CM | POA: Diagnosis not present

## 2019-11-02 DIAGNOSIS — Z0000 Encounter for general adult medical examination without abnormal findings: Secondary | ICD-10-CM | POA: Diagnosis not present

## 2019-11-02 DIAGNOSIS — N183 Chronic kidney disease, stage 3 unspecified: Secondary | ICD-10-CM | POA: Diagnosis not present

## 2019-11-13 DIAGNOSIS — M25552 Pain in left hip: Secondary | ICD-10-CM | POA: Diagnosis not present

## 2019-11-22 DIAGNOSIS — M25552 Pain in left hip: Secondary | ICD-10-CM | POA: Diagnosis not present

## 2019-11-24 DIAGNOSIS — M25552 Pain in left hip: Secondary | ICD-10-CM | POA: Diagnosis not present

## 2019-11-27 DIAGNOSIS — M25552 Pain in left hip: Secondary | ICD-10-CM | POA: Diagnosis not present

## 2019-11-28 DIAGNOSIS — L80 Vitiligo: Secondary | ICD-10-CM | POA: Diagnosis not present

## 2019-11-29 DIAGNOSIS — M25552 Pain in left hip: Secondary | ICD-10-CM | POA: Diagnosis not present

## 2019-12-05 DIAGNOSIS — M25552 Pain in left hip: Secondary | ICD-10-CM | POA: Diagnosis not present

## 2019-12-07 DIAGNOSIS — M25552 Pain in left hip: Secondary | ICD-10-CM | POA: Diagnosis not present

## 2019-12-12 DIAGNOSIS — M25552 Pain in left hip: Secondary | ICD-10-CM | POA: Diagnosis not present

## 2019-12-13 DIAGNOSIS — Z1231 Encounter for screening mammogram for malignant neoplasm of breast: Secondary | ICD-10-CM | POA: Diagnosis not present

## 2019-12-14 DIAGNOSIS — M25552 Pain in left hip: Secondary | ICD-10-CM | POA: Diagnosis not present

## 2019-12-16 DIAGNOSIS — Z20822 Contact with and (suspected) exposure to covid-19: Secondary | ICD-10-CM | POA: Diagnosis not present

## 2019-12-19 DIAGNOSIS — M25552 Pain in left hip: Secondary | ICD-10-CM | POA: Diagnosis not present

## 2020-01-04 DIAGNOSIS — M25552 Pain in left hip: Secondary | ICD-10-CM | POA: Diagnosis not present

## 2020-01-17 DIAGNOSIS — Z79891 Long term (current) use of opiate analgesic: Secondary | ICD-10-CM | POA: Diagnosis not present

## 2020-01-17 DIAGNOSIS — G894 Chronic pain syndrome: Secondary | ICD-10-CM | POA: Diagnosis not present

## 2020-01-17 DIAGNOSIS — M461 Sacroiliitis, not elsewhere classified: Secondary | ICD-10-CM | POA: Diagnosis not present

## 2020-01-17 DIAGNOSIS — M47816 Spondylosis without myelopathy or radiculopathy, lumbar region: Secondary | ICD-10-CM | POA: Diagnosis not present

## 2020-01-17 DIAGNOSIS — G5700 Lesion of sciatic nerve, unspecified lower limb: Secondary | ICD-10-CM | POA: Diagnosis not present

## 2020-01-17 DIAGNOSIS — M961 Postlaminectomy syndrome, not elsewhere classified: Secondary | ICD-10-CM | POA: Diagnosis not present

## 2020-01-17 DIAGNOSIS — M5136 Other intervertebral disc degeneration, lumbar region: Secondary | ICD-10-CM | POA: Diagnosis not present

## 2020-03-22 ENCOUNTER — Encounter: Payer: Self-pay | Admitting: Cardiology

## 2020-03-22 ENCOUNTER — Other Ambulatory Visit: Payer: Self-pay

## 2020-03-22 ENCOUNTER — Ambulatory Visit (INDEPENDENT_AMBULATORY_CARE_PROVIDER_SITE_OTHER): Payer: Medicare Other | Admitting: Cardiology

## 2020-03-22 VITALS — BP 140/80 | HR 70 | Ht 64.0 in | Wt 127.0 lb

## 2020-03-22 DIAGNOSIS — R079 Chest pain, unspecified: Secondary | ICD-10-CM

## 2020-03-22 DIAGNOSIS — T819XXA Unspecified complication of procedure, initial encounter: Secondary | ICD-10-CM | POA: Insufficient documentation

## 2020-03-22 DIAGNOSIS — G5700 Lesion of sciatic nerve, unspecified lower limb: Secondary | ICD-10-CM

## 2020-03-22 DIAGNOSIS — G894 Chronic pain syndrome: Secondary | ICD-10-CM | POA: Insufficient documentation

## 2020-03-22 DIAGNOSIS — M5136 Other intervertebral disc degeneration, lumbar region: Secondary | ICD-10-CM | POA: Insufficient documentation

## 2020-03-22 DIAGNOSIS — R0609 Other forms of dyspnea: Secondary | ICD-10-CM

## 2020-03-22 DIAGNOSIS — M51369 Other intervertebral disc degeneration, lumbar region without mention of lumbar back pain or lower extremity pain: Secondary | ICD-10-CM

## 2020-03-22 DIAGNOSIS — M47817 Spondylosis without myelopathy or radiculopathy, lumbosacral region: Secondary | ICD-10-CM

## 2020-03-22 DIAGNOSIS — R0600 Dyspnea, unspecified: Secondary | ICD-10-CM

## 2020-03-22 DIAGNOSIS — R002 Palpitations: Secondary | ICD-10-CM

## 2020-03-22 DIAGNOSIS — R634 Abnormal weight loss: Secondary | ICD-10-CM

## 2020-03-22 DIAGNOSIS — R0789 Other chest pain: Secondary | ICD-10-CM | POA: Insufficient documentation

## 2020-03-22 DIAGNOSIS — M461 Sacroiliitis, not elsewhere classified: Secondary | ICD-10-CM

## 2020-03-22 HISTORY — DX: Spondylosis without myelopathy or radiculopathy, lumbosacral region: M47.817

## 2020-03-22 HISTORY — DX: Other intervertebral disc degeneration, lumbar region: M51.36

## 2020-03-22 HISTORY — DX: Sacroiliitis, not elsewhere classified: M46.1

## 2020-03-22 HISTORY — DX: Other forms of dyspnea: R06.09

## 2020-03-22 HISTORY — DX: Palpitations: R00.2

## 2020-03-22 HISTORY — DX: Other intervertebral disc degeneration, lumbar region without mention of lumbar back pain or lower extremity pain: M51.369

## 2020-03-22 HISTORY — DX: Lesion of sciatic nerve, unspecified lower limb: G57.00

## 2020-03-22 HISTORY — DX: Abnormal weight loss: R63.4

## 2020-03-22 HISTORY — DX: Chronic pain syndrome: G89.4

## 2020-03-22 HISTORY — DX: Other chest pain: R07.89

## 2020-03-22 HISTORY — DX: Dyspnea, unspecified: R06.00

## 2020-03-22 HISTORY — DX: Unspecified complication of procedure, initial encounter: T81.9XXA

## 2020-03-22 NOTE — Patient Instructions (Signed)
Medication Instructions:  Your physician recommends that you continue on your current medications as directed. Please refer to the Current Medication list given to you today.  *If you need a refill on your cardiac medications before your next appointment, please call your pharmacy*   Lab Work: None.  If you have labs (blood work) drawn today and your tests are completely normal, you will receive your results only by: Marland Kitchen MyChart Message (if you have MyChart) OR . A paper copy in the mail If you have any lab test that is abnormal or we need to change your treatment, we will call you to review the results.   Testing/Procedures: Your physician has requested that you have an echocardiogram. Echocardiography is a painless test that uses sound waves to create images of your heart. It provides your doctor with information about the size and shape of your heart and how well your heart's chambers and valves are working. This procedure takes approximately one hour. There are no restrictions for this procedure.  A zio monitor was ordered today. It will remain on for 7 days. You will then return monitor and event diary in provided box. It takes 1-2 weeks for report to be downloaded and returned to [place]. We will call you with the results. If monitor falls off or has orange flashing light, please call Zio for further instructions.     [facility] at [facility] [address], [place], [place] [address] Phone: [phone]    Please arrive 15 minutes prior to your appointment time for registration and insurance purposes.  The test will take approximately 3 to 4 hours to complete; you may bring reading material.  If someone comes with you to your appointment, they will need to remain in the main lobby due to limited space in the testing area. **If you are pregnant or breastfeeding, please notify the nuclear lab prior to your appointment**  How to prepare for your  Myocardial Perfusion Test: . Do not eat or drink 3 hours prior to your test, except you may have water. . Do not consume products containing caffeine (regular or decaffeinated) 12 hours prior to your test. (ex: coffee, chocolate, sodas, tea). . Do bring a list of your current medications with you.  If not listed below, you may take your medications as normal. .  . Do wear comfortable clothes (no dresses or overalls) and walking shoes, tennis shoes preferred (No heels or open toe shoes are allowed). . Do NOT wear cologne, perfume, aftershave, or lotions (deodorant is allowed). . If these instructions are not followed, your test will have to be rescheduled.  Please report to [address], [address] for your test.  If you have questions or concerns about your appointment, you can call the Nuclear Lab at [phone].  If you cannot keep your appointment, please provide 24 hours notification to the Nuclear Lab, to avoid a possible $50 charge to your account.      Follow-Up: At [facility], you and your health needs are our priority.  As part of our continuing mission to provide you with exceptional heart care, we have created designated Provider Care Teams.  These Care Teams include your primary Cardiologist (physician) and Advanced Practice Providers (APPs -  Physician Assistants and Nurse Practitioners) who all work together to provide you with the care you need, when you need it.  We recommend signing up for the patient portal called "MyChart".  Sign up information is  provided on this After Visit Summary.  MyChart is used to connect with patients for Virtual Visits (Telemedicine).  Patients are able to view lab/test results, encounter notes, upcoming appointments, etc.  Non-urgent messages can be sent to your provider as well.   To learn more about what you can do with MyChart, go to [email or web address].    Your next appointment:   6 week(s)  The format for your next  appointment:   In Person  Provider:   [name], MD   Other Instructions  Echocardiogram An echocardiogram is a procedure that uses painless sound waves (ultrasound) to produce an image of the heart. Images from an echocardiogram can provide important information about:  Signs of coronary artery disease (CAD).  Aneurysm detection. An aneurysm is a weak or damaged part of an artery wall that bulges out from the normal force of blood pumping through the body.  Heart size and shape. Changes in the size or shape of the heart can be associated with certain conditions, including heart failure, aneurysm, and CAD.  Heart muscle function.  Heart valve function.  Signs of a past heart attack.  Fluid buildup around the heart.  Thickening of the heart muscle.  A tumor or infectious growth around the heart valves. Tell a health care provider about:  Any allergies you have.  All medicines you are taking, including vitamins, herbs, eye drops, creams, and over-the-counter medicines.  Any blood disorders you have.  Any surgeries you have had.  Any medical conditions you have.  Whether you are pregnant or may be pregnant. What are the risks? Generally, this is a safe procedure. However, problems may occur, including:  Allergic reaction to dye (contrast) that may be used during the procedure. What happens before the procedure? No specific preparation is needed. You may eat and drink normally. What happens during the procedure?   An IV tube may be inserted into one of your veins.  You may receive contrast through this tube. A contrast is an injection that improves the quality of the pictures from your heart.  A gel will be applied to your chest.  A wand-like tool (transducer) will be moved over your chest. The gel will help to transmit the sound waves from the transducer.  The sound waves will harmlessly bounce off of your heart to allow the heart images to be captured  in real-time motion. The images will be recorded on a computer. The procedure may vary among health care providers and hospitals. What happens after the procedure?  You may return to your normal, everyday life, including diet, activities, and medicines, unless your health care provider tells you not to do that. Summary  An echocardiogram is a procedure that uses painless sound waves (ultrasound) to produce an image of the heart.  Images from an echocardiogram can provide important information about the size and shape of your heart, heart muscle function, heart valve function, and fluid buildup around your heart.  You do not need to do anything to prepare before this procedure. You may eat and drink normally.  After the echocardiogram is completed, you may return to your normal, everyday life, unless your health care provider tells you not to do that. This information is not intended to replace advice given to you by your health care provider. Make sure you discuss any questions you have with your health care provider. Document Revised: 01/12/2019 Document Reviewed: 10/24/2016 Elsevier Patient Education  [place].   Cardiac Nuclear Scan A cardiac  nuclear scan is a test that measures blood flow to the heart when a person is resting and when he or she is exercising. The test looks for problems such as:  Not enough blood reaching a portion of the heart.  The heart muscle not working normally. You may need this test if:  You have heart disease.  You have had abnormal lab results.  You have had heart surgery or a balloon procedure to open up blocked arteries (angioplasty).  You have chest pain.  You have shortness of breath. In this test, a radioactive dye (tracer) is injected into your bloodstream. After the tracer has traveled to your heart, an imaging device is used to measure how much of the tracer is absorbed by or distributed to various areas of your heart. This  procedure is usually done at a hospital and takes 2-4 hours. Tell a health care provider about:  Any allergies you have.  All medicines you are taking, including vitamins, herbs, eye drops, creams, and over-the-counter medicines.  Any problems you or family members have had with anesthetic medicines.  Any blood disorders you have.  Any surgeries you have had.  Any medical conditions you have.  Whether you are pregnant or may be pregnant. What are the risks? Generally, this is a safe procedure. However, problems may occur, including:  Serious chest pain and heart attack. This is only a risk if the stress portion of the test is done.  Rapid heartbeat.  Sensation of warmth in your chest. This usually passes quickly.  Allergic reaction to the tracer. What happens before the procedure?  Ask your health care provider about changing or stopping your regular medicines. This is especially important if you are taking diabetes medicines or blood thinners.  Follow instructions from your health care provider about eating or drinking restrictions.  Remove your jewelry on the day of the procedure. What happens during the procedure?  An IV will be inserted into one of your veins.  Your health care provider will inject a small amount of radioactive tracer through the IV.  You will wait for 20-40 minutes while the tracer travels through your bloodstream.  Your heart activity will be monitored with an electrocardiogram (ECG).  You will lie down on an exam table.  Images of your heart will be taken for about 15-20 minutes.  You may also have a stress test. For this test, one of the following may be done: ? You will exercise on a treadmill or stationary bike. While you exercise, your heart's activity will be monitored with an ECG, and your blood pressure will be checked. ? You will be given medicines that will increase blood flow to parts of your heart. This is done if you are unable to  exercise.  When blood flow to your heart has peaked, a tracer will again be injected through the IV.  After 20-40 minutes, you will get back on the exam table and have more images taken of your heart.  Depending on the type of tracer used, scans may need to be repeated 3-4 hours later.  Your IV line will be removed when the procedure is over. The procedure may vary among health care providers and hospitals. What happens after the procedure?  Unless your health care provider tells you otherwise, you may return to your normal schedule, including diet, activities, and medicines.  Unless your health care provider tells you otherwise, you may increase your fluid intake. This will help to flush the contrast  dye from your body. Drink enough fluid to keep your urine pale yellow.  Ask your health care provider, or the department that is doing the test: ? When will my results be ready? ? How will I get my results? Summary  A cardiac nuclear scan measures the blood flow to the heart when a person is resting and when he or she is exercising.  Tell your health care provider if you are pregnant.  Before the procedure, ask your health care provider about changing or stopping your regular medicines. This is especially important if you are taking diabetes medicines or blood thinners.  After the procedure, unless your health care provider tells you otherwise, increase your fluid intake. This will help flush the contrast dye from your body.  After the procedure, unless your health care provider tells you otherwise, you may return to your normal schedule, including diet, activities, and medicines. This information is not intended to replace advice given to you by your health care provider. Make sure you discuss any questions you have with your health care provider. Document Revised: 03/07/2018 Document Reviewed: 03/07/2018 Elsevier Patient Education  [place].

## 2020-03-22 NOTE — Progress Notes (Signed)
Cardiology Consultation:    Date:  03/22/2020   ID:  Danielle Wright, DOB 1941/01/03, MRN [id number]  PCP:  Lujean Amel, MD  Cardiologist:  [name], MD   Referring MD: Lujean Amel, MD   No chief complaint on file. , Having palpitations and chest pain  History of Present Illness:    Danielle Wright is a 79 y.o. female who is being seen today for the evaluation of palpitation chest pain at the request of Koirala, Dibas, MD.  She is a sweet lady who 4 years ago relocated from [place] to this area.  She moved in here because of her husband and family who are living here.  She was referred to [place] because of palpitations.  She described the sensation that her heart will be going very fast.  Typically gradual onset gradual offset it usually happen when she gets upset.  She does not get any chest pain with this sensation no sweating no dizziness no passing out.  Another concern is chest pain that she has.  She describes sensation in the upper epigastrium and lower mid chest lasting for up to few minutes it typically happen when she is hungry.  Eating will help with the pain.  She also noticed some decrease in exercise tolerance.  Also noted some weight loss of about 20 pounds within the last few months.  She thinks that is because she is taking care of her husband was very sick and she simply doesn't have time to take care of herself.  She described to have a stress test many years ago which was negative.  She does not exercise on a regular basis she is not sure what her cholesterol is.  Sadly she still continues to smoke.  She smokes 1/2 pack/day.  She does have family history of coronary artery disease.  Past Medical History:  Diagnosis Date  . Emphysema of lung [facility])     Past Surgical History:  Procedure Laterality Date  . ABDOMINAL HYSTERECTOMY    . BACK SURGERY    . FOOT SURGERY Right   . SHOULDER SURGERY Right   . thumb surgery Right   . TOTAL HIP ARTHROPLASTY      Current  Medications: Current Meds  Medication Sig  . brimonidine (ALPHAGAN) 0.15 % ophthalmic solution 1 drop 2 (two) times daily.  . Cholecalciferol (VITAMIN D) 50 MCG (2000 UT) CAPS Take by mouth.  . Cinnamon 500 MG capsule   . clobetasol ointment (TEMOVATE) 0.05 % Apply topically.  Marland Kitchen estradiol (ESTRACE) 2 MG tablet   . mirtazapine (REMERON) 15 MG tablet Take 15 mg by mouth at bedtime.  Marland Kitchen oxyCODONE-acetaminophen (PERCOCET) 10-325 MG tablet Take 1 tablet by mouth every 4 (four) hours as needed for pain.     Allergies:   Amoxicillin, Cephalexin, Amoxapine, and Nucynta [tapentadol]   Social History   Socioeconomic History  . Marital status: Married    Spouse name: Not on file  . Number of children: Not on file  . Years of education: Not on file  . Highest education level: Not on file  Occupational History  . Not on file  Tobacco Use  . Smoking status: Current Every Day Smoker    Packs/day: 0.75    Years: 50.00    Pack years: 37.50    Types: Cigarettes  . Smokeless tobacco: Never Used  Substance and Sexual Activity  . Alcohol use: No  . Drug use: No  . Sexual activity: Not Currently    Birth  control/protection: Surgical  Other Topics Concern  . Not on file  Social History Narrative  . Not on file   Social Determinants of Health   Financial Resource Strain:   . Difficulty of Paying Living Expenses:   Food Insecurity:   . Worried About Charity fundraiser in the Last Year:   . Arboriculturist in the Last Year:   Transportation Needs:   . Film/video editor (Medical):   Marland Kitchen Lack of Transportation (Non-Medical):   Physical Activity:   . Days of Exercise per Week:   . Minutes of Exercise per Session:   Stress:   . Feeling of Stress :   Social Connections:   . Frequency of Communication with Friends and Family:   . Frequency of Social Gatherings with Friends and Family:   . Attends Religious Services:   . Active Member of Clubs or Organizations:   . Attends Theatre manager Meetings:   Marland Kitchen Marital Status:      Family History: The patient's family history includes Heart attack in her father; Stroke in her mother. There is no history of Breast cancer. ROS:   Please see the history of present illness.    All 14 point review of systems negative except as described per history of present illness.  EKGs/Labs/Other Studies Reviewed:    The following studies were reviewed today:   EKG:  EKG is  ordered today.  The ekg ordered today demonstrates sinus rhythm, first-degree AV block, APCs, incomplete right bundle branch block, left anterior fascicular block, criteria for LVH  Recent Labs: No results found for requested labs within last 8760 hours.  Recent Lipid Panel No results found for: CHOL, TRIG, HDL, CHOLHDL, VLDL, LDLCALC, LDLDIRECT  Physical Exam:    VS:  BP 140/80   Pulse 70   Ht 5\' 4"  (1.626 m)   Wt 127 lb (57.6 kg)   SpO2 94%   BMI 21.80 kg/m     Wt Readings from Last 3 Encounters:  03/22/20 127 lb (57.6 kg)  11/11/17 143 lb (64.9 kg)     GEN:  Well nourished, well developed in no acute distress HEENT: Normal NECK: No JVD; No carotid bruits LYMPHATICS: No lymphadenopathy CARDIAC: RRR, no murmurs, no rubs, no gallops RESPIRATORY:  Clear to auscultation without rales, wheezing or rhonchi  ABDOMEN: Soft, non-tender, non-distended MUSCULOSKELETAL:  No edema; No deformity  SKIN: Warm and dry NEUROLOGIC:  Alert and oriented x 3 PSYCHIATRIC:  Normal affect   ASSESSMENT:    1. Palpitation   2. Chest pain of uncertain etiology   3. Dyspnea on exertion   4. Unexplained weight loss   5. Palpitations   6. Atypical chest pain    PLAN:    In order of problems listed above:  1. Palpitations.  I will ask you to wear Zio patch for a week to see if she got any significant arrhythmia.  As a part of evaluation of stratification she will have echocardiogram done to assess left ventricle ejection fraction. 2. Chest pain look suspicious  for GI issue, however with her multiple risk factors which include smoking she needs to have evaluation for coronary artery disease which we'll do by doing a Lexiscan.  I know I will not change any of her medication at this stage. 3. 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  Again monitor will be placed to make sure she does not have any more advancing conduction disease problem.  4. Cholesterol status is unknown.  I will call primary care physician to get report of it.  Overall she is a very sweet lady.  She does have symptoms suggestive of GI etiology however I will rule out cardiac issues.  Concerning obviously is the fact that she lost significant amount of weight.  She does smoke and we spent at least 5 minutes talking about that I strongly advised her to quit.   Medication Adjustments/Labs and Tests Ordered: Current medicines are reviewed at length with the patient today.  Concerns regarding medicines are outlined above.  Orders Placed This Encounter  Procedures  . LONG TERM MONITOR (3-14 DAYS)  . MYOCARDIAL PERFUSION IMAGING  . EKG 12-Lead  . ECHOCARDIOGRAM COMPLETE   No orders of the defined types were placed in this encounter.   Signed, Park Liter, MD, [facility]. 03/22/2020 3:17 PM    [facility]

## 2020-03-29 ENCOUNTER — Ambulatory Visit (HOSPITAL_BASED_OUTPATIENT_CLINIC_OR_DEPARTMENT_OTHER)
Admission: RE | Admit: 2020-03-29 | Discharge: 2020-03-29 | Disposition: A | Discharge status: Home / Self Care | Payer: Medicare Other | Source: Ambulatory Visit | Attending: Cardiology | Admitting: Cardiology

## 2020-03-29 ENCOUNTER — Other Ambulatory Visit: Payer: Self-pay

## 2020-03-29 DIAGNOSIS — R0600 Dyspnea, unspecified: Secondary | ICD-10-CM | POA: Diagnosis present

## 2020-03-29 DIAGNOSIS — R002 Palpitations: Secondary | ICD-10-CM | POA: Diagnosis present

## 2020-03-29 DIAGNOSIS — R079 Chest pain, unspecified: Secondary | ICD-10-CM | POA: Diagnosis present

## 2020-03-29 DIAGNOSIS — R0609 Other forms of dyspnea: Secondary | ICD-10-CM

## 2020-04-01 ENCOUNTER — Ambulatory Visit (INDEPENDENT_AMBULATORY_CARE_PROVIDER_SITE_OTHER): Payer: Medicare Other

## 2020-04-01 ENCOUNTER — Telehealth (HOSPITAL_COMMUNITY): Payer: Self-pay

## 2020-04-01 DIAGNOSIS — R002 Palpitations: Secondary | ICD-10-CM | POA: Diagnosis not present

## 2020-04-01 NOTE — Telephone Encounter (Signed)
Attempted to contact the patient with detailed instructions for her stress test. I was told that she was unavailable. Will attempt to contact her later. S.Williams EMTP

## 2020-04-02 ENCOUNTER — Encounter (HOSPITAL_COMMUNITY): Payer: Medicare Other

## 2020-04-03 DIAGNOSIS — Z79891 Long term (current) use of opiate analgesic: Secondary | ICD-10-CM | POA: Diagnosis not present

## 2020-04-03 DIAGNOSIS — G5700 Lesion of sciatic nerve, unspecified lower limb: Secondary | ICD-10-CM | POA: Diagnosis not present

## 2020-04-03 DIAGNOSIS — M5136 Other intervertebral disc degeneration, lumbar region: Secondary | ICD-10-CM | POA: Diagnosis not present

## 2020-04-03 DIAGNOSIS — M961 Postlaminectomy syndrome, not elsewhere classified: Secondary | ICD-10-CM | POA: Diagnosis not present

## 2020-04-03 DIAGNOSIS — M47816 Spondylosis without myelopathy or radiculopathy, lumbar region: Secondary | ICD-10-CM | POA: Diagnosis not present

## 2020-04-03 DIAGNOSIS — G894 Chronic pain syndrome: Secondary | ICD-10-CM | POA: Diagnosis not present

## 2020-04-03 DIAGNOSIS — M461 Sacroiliitis, not elsewhere classified: Secondary | ICD-10-CM | POA: Diagnosis not present

## 2020-04-04 ENCOUNTER — Telehealth: Payer: Self-pay | Admitting: Cardiology

## 2020-04-04 ENCOUNTER — Encounter (HOSPITAL_COMMUNITY): Payer: Medicare Other

## 2020-04-04 NOTE — Telephone Encounter (Signed)
Patient calling stating her heart monitor fell off yesterday and is not sticking. She would like to know if it will affect it and whether she needs to try and put it on again. She states she tried using tape and that did not work.

## 2020-04-05 ENCOUNTER — Other Ambulatory Visit (HOSPITAL_BASED_OUTPATIENT_CLINIC_OR_DEPARTMENT_OTHER): Payer: Medicare Other

## 2020-04-05 NOTE — Telephone Encounter (Signed)
Attempted to call the patient back to see how long she has been wearing the monitor when it fell off, number was busy. Will continue efforts.

## 2020-04-10 ENCOUNTER — Telehealth (HOSPITAL_COMMUNITY): Payer: Self-pay | Admitting: Cardiology

## 2020-04-10 NOTE — Telephone Encounter (Signed)
I called patient to reschedule the Myoview that she No Showed on 04/04/2020.  Patient does not want the test and declined to reschedule.  The order will be removed from the [place] and if in the future she decides to reschedule we can reinstate the order. Thank you!

## 2020-04-12 NOTE — Telephone Encounter (Signed)
Left message for patient to return call.

## 2020-04-24 DIAGNOSIS — Z79899 Other long term (current) drug therapy: Secondary | ICD-10-CM | POA: Diagnosis not present

## 2020-04-24 DIAGNOSIS — D582 Other hemoglobinopathies: Secondary | ICD-10-CM | POA: Diagnosis not present

## 2020-04-24 DIAGNOSIS — N183 Chronic kidney disease, stage 3 unspecified: Secondary | ICD-10-CM | POA: Diagnosis not present

## 2020-04-24 DIAGNOSIS — Z13220 Encounter for screening for lipoid disorders: Secondary | ICD-10-CM | POA: Diagnosis not present

## 2020-04-24 DIAGNOSIS — I440 Atrioventricular block, first degree: Secondary | ICD-10-CM | POA: Diagnosis not present

## 2020-04-25 DIAGNOSIS — R002 Palpitations: Secondary | ICD-10-CM | POA: Diagnosis not present

## 2020-04-26 ENCOUNTER — Other Ambulatory Visit: Payer: Self-pay | Admitting: Cardiology

## 2020-04-26 DIAGNOSIS — R002 Palpitations: Secondary | ICD-10-CM

## 2020-04-30 NOTE — Telephone Encounter (Signed)
Called patient she already sent monitor back and we got report. No further questions.

## 2020-05-01 DIAGNOSIS — G4700 Insomnia, unspecified: Secondary | ICD-10-CM | POA: Diagnosis not present

## 2020-05-01 DIAGNOSIS — L80 Vitiligo: Secondary | ICD-10-CM | POA: Diagnosis not present

## 2020-05-01 DIAGNOSIS — F17200 Nicotine dependence, unspecified, uncomplicated: Secondary | ICD-10-CM | POA: Diagnosis not present

## 2020-05-01 DIAGNOSIS — R7303 Prediabetes: Secondary | ICD-10-CM | POA: Diagnosis not present

## 2020-05-01 DIAGNOSIS — Z13220 Encounter for screening for lipoid disorders: Secondary | ICD-10-CM | POA: Diagnosis not present

## 2020-05-01 DIAGNOSIS — N183 Chronic kidney disease, stage 3 unspecified: Secondary | ICD-10-CM | POA: Diagnosis not present

## 2020-05-01 DIAGNOSIS — M5136 Other intervertebral disc degeneration, lumbar region: Secondary | ICD-10-CM | POA: Diagnosis not present

## 2020-05-09 ENCOUNTER — Ambulatory Visit: Payer: Medicare Other | Admitting: Cardiology

## 2020-05-15 DIAGNOSIS — H527 Unspecified disorder of refraction: Secondary | ICD-10-CM | POA: Diagnosis not present

## 2020-05-15 DIAGNOSIS — H02834 Dermatochalasis of left upper eyelid: Secondary | ICD-10-CM | POA: Diagnosis not present

## 2020-05-15 DIAGNOSIS — H52203 Unspecified astigmatism, bilateral: Secondary | ICD-10-CM | POA: Diagnosis not present

## 2020-05-15 DIAGNOSIS — H40003 Preglaucoma, unspecified, bilateral: Secondary | ICD-10-CM | POA: Diagnosis not present

## 2020-05-15 DIAGNOSIS — H02831 Dermatochalasis of right upper eyelid: Secondary | ICD-10-CM | POA: Diagnosis not present

## 2020-05-15 DIAGNOSIS — H43812 Vitreous degeneration, left eye: Secondary | ICD-10-CM | POA: Diagnosis not present

## 2020-05-15 DIAGNOSIS — H25813 Combined forms of age-related cataract, bilateral: Secondary | ICD-10-CM | POA: Diagnosis not present

## 2020-06-03 DIAGNOSIS — M961 Postlaminectomy syndrome, not elsewhere classified: Secondary | ICD-10-CM | POA: Diagnosis not present

## 2020-06-03 DIAGNOSIS — M47816 Spondylosis without myelopathy or radiculopathy, lumbar region: Secondary | ICD-10-CM | POA: Diagnosis not present

## 2020-06-03 DIAGNOSIS — G894 Chronic pain syndrome: Secondary | ICD-10-CM | POA: Diagnosis not present

## 2020-06-03 DIAGNOSIS — M5136 Other intervertebral disc degeneration, lumbar region: Secondary | ICD-10-CM | POA: Diagnosis not present

## 2020-06-03 DIAGNOSIS — M461 Sacroiliitis, not elsewhere classified: Secondary | ICD-10-CM | POA: Diagnosis not present

## 2020-06-03 DIAGNOSIS — G5700 Lesion of sciatic nerve, unspecified lower limb: Secondary | ICD-10-CM | POA: Diagnosis not present

## 2020-06-03 DIAGNOSIS — Z79891 Long term (current) use of opiate analgesic: Secondary | ICD-10-CM | POA: Diagnosis not present

## 2020-06-18 DIAGNOSIS — H25813 Combined forms of age-related cataract, bilateral: Secondary | ICD-10-CM | POA: Diagnosis not present

## 2020-06-19 DIAGNOSIS — Z20822 Contact with and (suspected) exposure to covid-19: Secondary | ICD-10-CM | POA: Diagnosis not present

## 2020-06-19 DIAGNOSIS — Z01812 Encounter for preprocedural laboratory examination: Secondary | ICD-10-CM | POA: Diagnosis not present

## 2020-06-20 DIAGNOSIS — H2181 Floppy iris syndrome: Secondary | ICD-10-CM | POA: Diagnosis not present

## 2020-06-20 DIAGNOSIS — F17210 Nicotine dependence, cigarettes, uncomplicated: Secondary | ICD-10-CM | POA: Diagnosis not present

## 2020-06-20 DIAGNOSIS — H25811 Combined forms of age-related cataract, right eye: Secondary | ICD-10-CM

## 2020-06-20 DIAGNOSIS — F17200 Nicotine dependence, unspecified, uncomplicated: Secondary | ICD-10-CM | POA: Diagnosis not present

## 2020-06-20 HISTORY — DX: Combined forms of age-related cataract, right eye: H25.811

## 2020-06-21 ENCOUNTER — Other Ambulatory Visit: Payer: Self-pay

## 2020-06-21 DIAGNOSIS — Z961 Presence of intraocular lens: Secondary | ICD-10-CM | POA: Diagnosis not present

## 2020-06-21 DIAGNOSIS — J439 Emphysema, unspecified: Secondary | ICD-10-CM | POA: Insufficient documentation

## 2020-06-24 ENCOUNTER — Encounter: Payer: Self-pay | Admitting: Cardiology

## 2020-06-24 ENCOUNTER — Other Ambulatory Visit: Payer: Self-pay

## 2020-06-24 ENCOUNTER — Ambulatory Visit (INDEPENDENT_AMBULATORY_CARE_PROVIDER_SITE_OTHER): Payer: Medicare Other | Admitting: Cardiology

## 2020-06-24 VITALS — BP 136/64 | HR 84 | Ht 64.0 in | Wt 129.0 lb

## 2020-06-24 DIAGNOSIS — R002 Palpitations: Secondary | ICD-10-CM | POA: Diagnosis not present

## 2020-06-24 DIAGNOSIS — E785 Hyperlipidemia, unspecified: Secondary | ICD-10-CM

## 2020-06-24 DIAGNOSIS — R0600 Dyspnea, unspecified: Secondary | ICD-10-CM

## 2020-06-24 DIAGNOSIS — R0609 Other forms of dyspnea: Secondary | ICD-10-CM

## 2020-06-24 DIAGNOSIS — IMO0001 Reserved for inherently not codable concepts without codable children: Secondary | ICD-10-CM

## 2020-06-24 DIAGNOSIS — F17200 Nicotine dependence, unspecified, uncomplicated: Secondary | ICD-10-CM

## 2020-06-24 DIAGNOSIS — R0789 Other chest pain: Secondary | ICD-10-CM | POA: Diagnosis not present

## 2020-06-24 HISTORY — DX: Hyperlipidemia, unspecified: E78.5

## 2020-06-24 HISTORY — DX: Nicotine dependence, unspecified, uncomplicated: F17.200

## 2020-06-24 HISTORY — DX: Reserved for inherently not codable concepts without codable children: IMO0001

## 2020-06-24 NOTE — Addendum Note (Signed)
Addended by: Senaida Ores on: 06/24/2020 04:11 PM   Modules accepted: Orders

## 2020-06-24 NOTE — Patient Instructions (Signed)
Medication Instructions:  Your physician recommends that you continue on your current medications as directed. Please refer to the Current Medication list given to you today.  *If you need a refill on your cardiac medications before your next appointment, please call your pharmacy*   Lab Work: None. If you have labs (blood work) drawn today and your tests are completely normal, you will receive your results only by: Marland Kitchen MyChart Message (if you have MyChart) OR . A paper copy in the mail If you have any lab test that is abnormal or we need to change your treatment, we will call you to review the results.   Testing/Procedures:   [facility] at [facility] [address] [place], [place] [address] Phone: [phone]    Please arrive 15 minutes prior to your appointment time for registration and insurance purposes.  The test will take approximately 3 to 4 hours to complete; you may bring reading material.  If someone comes with you to your appointment, they will need to remain in the main lobby due to limited space in the testing area. **If you are pregnant or breastfeeding, please notify the nuclear lab prior to your appointment**  How to prepare for your Myocardial Perfusion Test: . Do not eat or drink 3 hours prior to your test, except you may have water. . Do not consume products containing caffeine (regular or decaffeinated) 12 hours prior to your test. (ex: coffee, chocolate, sodas, tea). . Do bring a list of your current medications with you.  If not listed below, you may take your medications as normal. . Do wear comfortable clothes (no dresses or overalls) and walking shoes, tennis shoes preferred (No heels or open toe shoes are allowed). . Do NOT wear cologne, perfume, aftershave, or lotions (deodorant is allowed). . If these instructions are not followed, your test will have to be rescheduled.  Please report to [address]  [address] for your test.  If you have questions or concerns about your appointment, you can call the Nuclear Lab at [phone].  If you cannot keep your appointment, please provide 24 hours notification to the Nuclear Lab, to avoid a possible $50 charge to your account.    Follow-Up: At [facility], you and your health needs are our priority.  As part of our continuing mission to provide you with exceptional heart care, we have created designated Provider Care Teams.  These Care Teams include your primary Cardiologist (physician) and Advanced Practice Providers (APPs -  Physician Assistants and Nurse Practitioners) who all work together to provide you with the care you need, when you need it.  We recommend signing up for the patient portal called "MyChart".  Sign up information is provided on this After Visit Summary.  MyChart is used to connect with patients for Virtual Visits (Telemedicine).  Patients are able to view lab/test results, encounter notes, upcoming appointments, etc.  Non-urgent messages can be sent to your provider as well.   To learn more about what you can do with MyChart, go to [email or web address].    Your next appointment:   5 month(s)  The format for your next appointment:   In Person  Provider:   [name], MD   Other Instructions   Cardiac Nuclear Scan A cardiac nuclear scan is a test that measures blood flow to the heart when a person is resting and when he or she is exercising. The test looks for problems such as:  Not enough blood reaching a portion of the heart.  The heart muscle not working normally. You may need this test if:  You have heart disease.  You have had abnormal lab results.  You have had heart surgery or a balloon procedure to open up blocked arteries (angioplasty).  You have chest pain.  You have shortness of breath. In this test, a radioactive dye (tracer) is injected into your bloodstream. After the tracer has  traveled to your heart, an imaging device is used to measure how much of the tracer is absorbed by or distributed to various areas of your heart. This procedure is usually done at a hospital and takes 2-4 hours. Tell a health care provider about:  Any allergies you have.  All medicines you are taking, including vitamins, herbs, eye drops, creams, and over-the-counter medicines.  Any problems you or family members have had with anesthetic medicines.  Any blood disorders you have.  Any surgeries you have had.  Any medical conditions you have.  Whether you are pregnant or may be pregnant. What are the risks? Generally, this is a safe procedure. However, problems may occur, including:  Serious chest pain and heart attack. This is only a risk if the stress portion of the test is done.  Rapid heartbeat.  Sensation of warmth in your chest. This usually passes quickly.  Allergic reaction to the tracer. What happens before the procedure?  Ask your health care provider about changing or stopping your regular medicines. This is especially important if you are taking diabetes medicines or blood thinners.  Follow instructions from your health care provider about eating or drinking restrictions.  Remove your jewelry on the day of the procedure. What happens during the procedure?  An IV will be inserted into one of your veins.  Your health care provider will inject a small amount of radioactive tracer through the IV.  You will wait for 20-40 minutes while the tracer travels through your bloodstream.  Your heart activity will be monitored with an electrocardiogram (ECG).  You will lie down on an exam table.  Images of your heart will be taken for about 15-20 minutes.  You may also have a stress test. For this test, one of the following may be done: ? You will exercise on a treadmill or stationary bike. While you exercise, your heart's activity will be monitored with an ECG, and your  blood pressure will be checked. ? You will be given medicines that will increase blood flow to parts of your heart. This is done if you are unable to exercise.  When blood flow to your heart has peaked, a tracer will again be injected through the IV.  After 20-40 minutes, you will get back on the exam table and have more images taken of your heart.  Depending on the type of tracer used, scans may need to be repeated 3-4 hours later.  Your IV line will be removed when the procedure is over. The procedure may vary among health care providers and hospitals. What happens after the procedure?  Unless your health care provider tells you otherwise, you may return to your normal schedule, including diet, activities, and medicines.  Unless your health care provider tells you otherwise, you may increase your fluid intake. This will help to flush the contrast dye from your body. Drink enough fluid to keep your urine pale yellow.  Ask your health care provider, or the department that is doing the test: ? When will my results  be ready? ? How will I get my results? Summary  A cardiac nuclear scan measures the blood flow to the heart when a person is resting and when he or she is exercising.  Tell your health care provider if you are pregnant.  Before the procedure, ask your health care provider about changing or stopping your regular medicines. This is especially important if you are taking diabetes medicines or blood thinners.  After the procedure, unless your health care provider tells you otherwise, increase your fluid intake. This will help flush the contrast dye from your body.  After the procedure, unless your health care provider tells you otherwise, you may return to your normal schedule, including diet, activities, and medicines. This information is not intended to replace advice given to you by your health care provider. Make sure you discuss any questions you have with your health care  provider. Document Revised: 03/07/2018 Document Reviewed: 03/07/2018 Elsevier Patient Education  [employer].

## 2020-06-24 NOTE — Progress Notes (Signed)
Cardiology Office Note:    Date:  06/24/2020   ID:  Danielle Wright, DOB 11/05/1940, MRN [id number]  PCP:  [name], DO  Cardiologist:  [name], MD    Referring MD: Lujean Amel, MD   No chief complaint on file. I am doing fine  History of Present Illness:    Danielle Wright is a 79 y.o. female who was referred to [place] because of some palpitations.  She did wear monitor which showed some PVCs APCs which were symptomatic 1 run of what appears to be aberrantly conducted SVT.  Another issue she also had atypical chest pain.  She comes to [place] to discuss her test.  Again monitor showed some APCs PVCs which were symptomatic but because of complex intraventricular conduction problem I prefer not to treat this especially since she is very mildly symptomatic.  As a part of evaluation especially with multiple risk factors for coronary artery disease I wanted her to have a stress test however she thought she was it was not necessary however today I was able to convince her to pursue stress testing which we will do.  On top of that I calculated her risk for coronary artery disease today in 10 years risk is more than 35% which is very high and I strongly recommended to start him back on statin.  Past Medical History:  Diagnosis Date  . Atypical chest pain 03/22/2020  . Chronic pain syndrome 03/22/2020  . Combined forms of age-related cataract of right eye 06/20/2020  . Complication of surgical procedure 03/22/2020  . Degeneration of lumbar intervertebral disc 03/22/2020  . Dyspnea on exertion 03/22/2020  . Emphysema of lung ([place])   . Lumbosacral spondylosis without myelopathy 03/22/2020  . Palpitations 03/22/2020  . Sacroiliitis, not elsewhere classified ([place]) 03/22/2020  . Sciatic nerve lesion 03/22/2020  . Unexplained weight loss 03/22/2020    Past Surgical History:  Procedure Laterality Date  . ABDOMINAL HYSTERECTOMY    . BACK SURGERY    . FOOT SURGERY Right   . SHOULDER SURGERY Right   .  thumb surgery Right   . TOTAL HIP ARTHROPLASTY      Current Medications: Current Meds  Medication Sig  . brimonidine (ALPHAGAN) 0.15 % ophthalmic solution 1 drop 2 (two) times daily.  . Calcium Carbonate-Vitamin D (OYSTER SHELL CALCIUM/D) 250-125 MG-UNIT TABS Take 1 tablet by mouth daily.  . Cholecalciferol (VITAMIN D) 50 MCG (2000 UT) CAPS Take by mouth.  . Cinnamon 500 MG capsule   . clobetasol ointment (TEMOVATE) 0.05 % Apply topically.  Marland Kitchen estradiol (ESTRACE) 2 MG tablet 1 tablet  . meloxicam (MOBIC) 15 MG tablet 1 tablet  . mirtazapine (REMERON) 30 MG tablet Take 30 mg by mouth at bedtime.  . Multiple Vitamin (MULTIVITAMIN) tablet Take 1 tablet by mouth daily.  Marland Kitchen oxycodone-acetaminophen (PERCOCET) 2.5-325 MG tablet Take 1 tablet by mouth every 8 (eight) hours as needed.     Allergies:   Amoxicillin, Cephalexin, Amoxapine, Etodolac, Tapentadol, and Other   Social History   Socioeconomic History  . Marital status: Married    Spouse name: Not on file  . Number of children: Not on file  . Years of education: Not on file  . Highest education level: Not on file  Occupational History  . Not on file  Tobacco Use  . Smoking status: Current Every Day Smoker    Packs/day: 0.75    Years: 50.00    Pack years: 37.50    Types: Cigarettes  . Smokeless  tobacco: Never Used  Substance and Sexual Activity  . Alcohol use: No  . Drug use: No  . Sexual activity: Not Currently    Birth control/protection: Surgical  Other Topics Concern  . Not on file  Social History Narrative  . Not on file   Social Determinants of Health   Financial Resource Strain:   . Difficulty of Paying Living Expenses: Not on file  Food Insecurity:   . Worried About Charity fundraiser in the Last Year: Not on file  . Ran Out of Food in the Last Year: Not on file  Transportation Needs:   . Lack of Transportation (Medical): Not on file  . Lack of Transportation (Non-Medical): Not on file  Physical Activity:    . Days of Exercise per Week: Not on file  . Minutes of Exercise per Session: Not on file  Stress:   . Feeling of Stress : Not on file  Social Connections:   . Frequency of Communication with Friends and Family: Not on file  . Frequency of Social Gatherings with Friends and Family: Not on file  . Attends Religious Services: Not on file  . Active Member of Clubs or Organizations: Not on file  . Attends Archivist Meetings: Not on file  . Marital Status: Not on file     Family History: The patient's family history includes Heart attack in her father; Stroke in her mother. There is no history of Breast cancer. ROS:   Please see the history of present illness.    All 14 point review of systems negative except as described per history of present illness  EKGs/Labs/Other Studies Reviewed:      Recent Labs: No results found for requested labs within last 8760 hours.  Recent Lipid Panel No results found for: CHOL, TRIG, HDL, CHOLHDL, VLDL, LDLCALC, LDLDIRECT  Physical Exam:    VS:  BP 136/64   Pulse 84   Ht 5\' 4"  (1.626 m)   Wt 129 lb (58.5 kg)   SpO2 99%   BMI 22.14 kg/m     Wt Readings from Last 3 Encounters:  06/24/20 129 lb (58.5 kg)  03/22/20 127 lb (57.6 kg)  11/11/17 143 lb (64.9 kg)     GEN:  Well nourished, well developed in no acute distress HEENT: Normal NECK: No JVD; No carotid bruits LYMPHATICS: No lymphadenopathy CARDIAC: RRR, no murmurs, no rubs, no gallops RESPIRATORY:  Clear to auscultation without rales, wheezing or rhonchi  ABDOMEN: Soft, non-tender, non-distended MUSCULOSKELETAL:  No edema; No deformity  SKIN: Warm and dry LOWER EXTREMITIES: no swelling NEUROLOGIC:  Alert and oriented x 3 PSYCHIATRIC:  Normal affect   ASSESSMENT:    1. Palpitations   2. Dyspnea on exertion   3. Atypical chest pain   4. Dyslipidemia   5. Smoking    PLAN:    In order of problems listed above:  1. Palpitations PVCs APCs does not bother her much  on top of that she does have first-degree AV block intraventricular conduction delay, therefore, I will not use beta-blocker, I will not use calcium channel blocker if she became symptomatic some medication may be needed but at this stage I prefer not to do it.  On top of that she did have some wide-complex tachycardia which I suspect is apparently conducted SVT I will schedule her however to stress test to rule out significant ischemia as part of risk calcifications. 2. Dyspnea exertion probably multifactorial she is smoking and I stressed  again importance of quitting it. 3. Atypical chest pain stress test will be done 4. Dyslipidemia she will go back on simvastatin which I strongly recommended.  Also recommend he start taking aspirin daily.   Medication Adjustments/Labs and Tests Ordered: Current medicines are reviewed at length with the patient today.  Concerns regarding medicines are outlined above.  No orders of the defined types were placed in this encounter.  Medication changes: No orders of the defined types were placed in this encounter.   Signed, Park Liter, MD, [facility] 06/24/2020 4:02 PM    [facility]

## 2020-06-27 ENCOUNTER — Telehealth (HOSPITAL_COMMUNITY): Payer: Self-pay

## 2020-06-27 NOTE — Telephone Encounter (Signed)
Spoke with the patient, detailed instructions given. She stated that she understood and would be here for her test. S.Danne Vasek EMTP

## 2020-07-02 ENCOUNTER — Ambulatory Visit (HOSPITAL_COMMUNITY): Payer: Medicare Other | Attending: Cardiology

## 2020-07-02 ENCOUNTER — Other Ambulatory Visit: Payer: Self-pay

## 2020-07-02 DIAGNOSIS — R0789 Other chest pain: Secondary | ICD-10-CM

## 2020-07-02 LAB — MYOCARDIAL PERFUSION IMAGING
LV dias vol: 60 mL (ref 46–106)
LV sys vol: 17 mL
Peak HR: 97 {beats}/min
Rest HR: 65 {beats}/min
SDS: 2
SRS: 0
SSS: 2
TID: 0.88

## 2020-07-02 MED ORDER — REGADENOSON 0.4 MG/5ML IV SOLN
0.4000 mg | Freq: Once | INTRAVENOUS | Status: AC
  Administered 2020-07-02: 0.4 mg via INTRAVENOUS

## 2020-07-02 MED ORDER — TECHNETIUM TC 99M TETROFOSMIN IV KIT
31.5000 | PACK | Freq: Once | INTRAVENOUS | Status: AC | PRN
  Administered 2020-07-02: 31.5 via INTRAVENOUS
  Filled 2020-07-02: qty 32

## 2020-07-02 MED ORDER — TECHNETIUM TC 99M TETROFOSMIN IV KIT
10.8000 | PACK | Freq: Once | INTRAVENOUS | Status: AC | PRN
  Administered 2020-07-02: 10.8 via INTRAVENOUS
  Filled 2020-07-02: qty 11

## 2020-08-12 DIAGNOSIS — M47816 Spondylosis without myelopathy or radiculopathy, lumbar region: Secondary | ICD-10-CM | POA: Diagnosis not present

## 2020-08-12 DIAGNOSIS — G894 Chronic pain syndrome: Secondary | ICD-10-CM | POA: Diagnosis not present

## 2020-08-12 DIAGNOSIS — G5700 Lesion of sciatic nerve, unspecified lower limb: Secondary | ICD-10-CM | POA: Diagnosis not present

## 2020-08-12 DIAGNOSIS — M461 Sacroiliitis, not elsewhere classified: Secondary | ICD-10-CM | POA: Diagnosis not present

## 2020-08-12 DIAGNOSIS — Z79891 Long term (current) use of opiate analgesic: Secondary | ICD-10-CM | POA: Diagnosis not present

## 2020-08-12 DIAGNOSIS — M961 Postlaminectomy syndrome, not elsewhere classified: Secondary | ICD-10-CM | POA: Diagnosis not present

## 2020-08-12 DIAGNOSIS — M5136 Other intervertebral disc degeneration, lumbar region: Secondary | ICD-10-CM | POA: Diagnosis not present

## 2020-08-19 DIAGNOSIS — H25812 Combined forms of age-related cataract, left eye: Secondary | ICD-10-CM | POA: Diagnosis not present

## 2020-09-11 DIAGNOSIS — Z961 Presence of intraocular lens: Secondary | ICD-10-CM | POA: Diagnosis not present

## 2020-09-11 DIAGNOSIS — H25812 Combined forms of age-related cataract, left eye: Secondary | ICD-10-CM | POA: Diagnosis not present

## 2020-09-12 DIAGNOSIS — Z961 Presence of intraocular lens: Secondary | ICD-10-CM | POA: Diagnosis not present

## 2020-09-12 DIAGNOSIS — Z79899 Other long term (current) drug therapy: Secondary | ICD-10-CM | POA: Diagnosis not present

## 2020-09-12 DIAGNOSIS — H52223 Regular astigmatism, bilateral: Secondary | ICD-10-CM

## 2020-09-12 DIAGNOSIS — H02834 Dermatochalasis of left upper eyelid: Secondary | ICD-10-CM | POA: Diagnosis not present

## 2020-09-12 DIAGNOSIS — F17200 Nicotine dependence, unspecified, uncomplicated: Secondary | ICD-10-CM | POA: Diagnosis not present

## 2020-09-12 DIAGNOSIS — H25812 Combined forms of age-related cataract, left eye: Secondary | ICD-10-CM

## 2020-09-12 DIAGNOSIS — Z7982 Long term (current) use of aspirin: Secondary | ICD-10-CM | POA: Diagnosis not present

## 2020-09-12 DIAGNOSIS — F17210 Nicotine dependence, cigarettes, uncomplicated: Secondary | ICD-10-CM | POA: Diagnosis not present

## 2020-09-12 DIAGNOSIS — H40003 Preglaucoma, unspecified, bilateral: Secondary | ICD-10-CM | POA: Diagnosis not present

## 2020-09-12 DIAGNOSIS — H02831 Dermatochalasis of right upper eyelid: Secondary | ICD-10-CM | POA: Diagnosis not present

## 2020-09-12 DIAGNOSIS — Z888 Allergy status to other drugs, medicaments and biological substances status: Secondary | ICD-10-CM | POA: Diagnosis not present

## 2020-09-12 HISTORY — DX: Regular astigmatism, bilateral: H52.223

## 2020-09-12 HISTORY — DX: Combined forms of age-related cataract, left eye: H25.812

## 2020-09-13 DIAGNOSIS — Z961 Presence of intraocular lens: Secondary | ICD-10-CM | POA: Diagnosis not present

## 2020-10-14 DIAGNOSIS — M5136 Other intervertebral disc degeneration, lumbar region: Secondary | ICD-10-CM | POA: Diagnosis not present

## 2020-10-14 DIAGNOSIS — M461 Sacroiliitis, not elsewhere classified: Secondary | ICD-10-CM | POA: Diagnosis not present

## 2020-10-14 DIAGNOSIS — M961 Postlaminectomy syndrome, not elsewhere classified: Secondary | ICD-10-CM | POA: Diagnosis not present

## 2020-10-14 DIAGNOSIS — G894 Chronic pain syndrome: Secondary | ICD-10-CM | POA: Diagnosis not present

## 2020-10-14 DIAGNOSIS — M47816 Spondylosis without myelopathy or radiculopathy, lumbar region: Secondary | ICD-10-CM | POA: Diagnosis not present

## 2020-10-14 DIAGNOSIS — G5700 Lesion of sciatic nerve, unspecified lower limb: Secondary | ICD-10-CM | POA: Diagnosis not present

## 2020-10-14 DIAGNOSIS — Z79891 Long term (current) use of opiate analgesic: Secondary | ICD-10-CM | POA: Diagnosis not present

## 2020-11-01 DIAGNOSIS — Z13220 Encounter for screening for lipoid disorders: Secondary | ICD-10-CM | POA: Diagnosis not present

## 2020-11-01 DIAGNOSIS — R7303 Prediabetes: Secondary | ICD-10-CM | POA: Diagnosis not present

## 2020-11-01 DIAGNOSIS — E7800 Pure hypercholesterolemia, unspecified: Secondary | ICD-10-CM | POA: Diagnosis not present

## 2020-11-01 DIAGNOSIS — N183 Chronic kidney disease, stage 3 unspecified: Secondary | ICD-10-CM | POA: Diagnosis not present

## 2020-11-04 DIAGNOSIS — F17200 Nicotine dependence, unspecified, uncomplicated: Secondary | ICD-10-CM | POA: Diagnosis not present

## 2020-11-04 DIAGNOSIS — M5136 Other intervertebral disc degeneration, lumbar region: Secondary | ICD-10-CM | POA: Diagnosis not present

## 2020-11-04 DIAGNOSIS — Z0000 Encounter for general adult medical examination without abnormal findings: Secondary | ICD-10-CM | POA: Diagnosis not present

## 2020-11-04 DIAGNOSIS — G4700 Insomnia, unspecified: Secondary | ICD-10-CM | POA: Diagnosis not present

## 2020-11-04 DIAGNOSIS — K5903 Drug induced constipation: Secondary | ICD-10-CM | POA: Diagnosis not present

## 2020-11-04 DIAGNOSIS — N183 Chronic kidney disease, stage 3 unspecified: Secondary | ICD-10-CM | POA: Diagnosis not present

## 2020-11-04 DIAGNOSIS — E559 Vitamin D deficiency, unspecified: Secondary | ICD-10-CM | POA: Diagnosis not present

## 2020-11-04 DIAGNOSIS — R7303 Prediabetes: Secondary | ICD-10-CM | POA: Diagnosis not present

## 2020-11-04 DIAGNOSIS — E7800 Pure hypercholesterolemia, unspecified: Secondary | ICD-10-CM | POA: Diagnosis not present

## 2020-11-15 ENCOUNTER — Ambulatory Visit: Payer: Medicare Other | Admitting: Cardiology

## 2020-12-05 DIAGNOSIS — Z79891 Long term (current) use of opiate analgesic: Secondary | ICD-10-CM | POA: Diagnosis not present

## 2020-12-05 DIAGNOSIS — G894 Chronic pain syndrome: Secondary | ICD-10-CM | POA: Diagnosis not present

## 2020-12-12 ENCOUNTER — Encounter: Payer: Self-pay | Admitting: Cardiology

## 2020-12-12 ENCOUNTER — Other Ambulatory Visit: Payer: Self-pay

## 2020-12-12 ENCOUNTER — Ambulatory Visit (INDEPENDENT_AMBULATORY_CARE_PROVIDER_SITE_OTHER): Payer: Medicare Other | Admitting: Cardiology

## 2020-12-12 VITALS — BP 92/50 | HR 63 | Ht 64.0 in | Wt 132.0 lb

## 2020-12-12 DIAGNOSIS — R0789 Other chest pain: Secondary | ICD-10-CM

## 2020-12-12 DIAGNOSIS — J432 Centrilobular emphysema: Secondary | ICD-10-CM

## 2020-12-12 DIAGNOSIS — R002 Palpitations: Secondary | ICD-10-CM | POA: Diagnosis not present

## 2020-12-12 NOTE — Patient Instructions (Signed)
Medication Instructions:  Your physician recommends that you continue on your current medications as directed. Please refer to the Current Medication list given to you today.  *If you need a refill on your cardiac medications before your next appointment, please call your pharmacy*   Lab Work: None If you have labs (blood work) drawn today and your tests are completely normal, you will receive your results only by: Marland Kitchen MyChart Message (if you have MyChart) OR . A paper copy in the mail If you have any lab test that is abnormal or we need to change your treatment, we will call you to review the results.   Testing/Procedures: none   Follow-Up: At [facility], you and your health needs are our priority.  As part of our continuing mission to provide you with exceptional heart care, we have created designated Provider Care Teams.  These Care Teams include your primary Cardiologist (physician) and Advanced Practice Providers (APPs -  Physician Assistants and Nurse Practitioners) who all work together to provide you with the care you need, when you need it.  We recommend signing up for the patient portal called "MyChart".  Sign up information is provided on this After Visit Summary.  MyChart is used to connect with patients for Virtual Visits (Telemedicine).  Patients are able to view lab/test results, encounter notes, upcoming appointments, etc.  Non-urgent messages can be sent to your provider as well.   To learn more about what you can do with MyChart, go to [email or web address].    Your next appointment:   6 month(s)  The format for your next appointment:   In Person  Provider:   [name], MD   Other Instructions

## 2020-12-12 NOTE — Progress Notes (Signed)
Cardiology Office Note:    Date:  12/12/2020   ID:  Danielle Wright, DOB [birth year], MRN [id number]  PCP:  [name], DO  Cardiologist:  [name], MD    Referring MD: [name], DO   Chief Complaint  Patient presents with  . Tachycardia    History of Present Illness:    Danielle Wright is a 80 y.o. female with past medical history significant for PVCs/APCs, SVT noted on the monitor.  She was sent was actually because of palpitations also emphysema, essential hypertension, dyslipidemia.  Comes today 2 months of follow-up.  She lost her husband of 26 years and she is grieving after that.  Denies have any cardiac complaint she said overall she is doing well she do not think she can plan is the fact that she is staying with her daughter and she does have to climb 14 steps every single time and that make her somewhat tired and does feel her heart speeding up but overall seems to be doing well  Past Medical History:  Diagnosis Date  . Atypical chest pain 03/22/2020  . Chronic pain syndrome 03/22/2020  . Combined forms of age-related cataract of left eye 09/12/2020  . Combined forms of age-related cataract of right eye 06/20/2020  . Complication of surgical procedure 03/22/2020  . Degeneration of lumbar intervertebral disc 03/22/2020  . Dyslipidemia 06/24/2020  . Dyspnea on exertion 03/22/2020  . Emphysema of lung ([place])   . Lumbosacral spondylosis without myelopathy 03/22/2020  . Palpitations 03/22/2020  . Regular astigmatism, bilateral 09/12/2020  . Sacroiliitis, not elsewhere classified ([place]) 03/22/2020  . Sciatic nerve lesion 03/22/2020  . Smoking 06/24/2020  . Unexplained weight loss 03/22/2020    Past Surgical History:  Procedure Laterality Date  . ABDOMINAL HYSTERECTOMY    . BACK SURGERY    . FOOT SURGERY Right   . SHOULDER SURGERY Right   . thumb surgery Right   . TOTAL HIP ARTHROPLASTY      Current Medications: Current Meds  Medication Sig  . brimonidine (ALPHAGAN) 0.15  % ophthalmic solution 1 drop 2 (two) times daily.  . Calcium Carbonate-Vitamin D (OYSTER SHELL CALCIUM/D) 250-125 MG-UNIT TABS Take 1 tablet by mouth daily.  . Cholecalciferol (VITAMIN D) 50 MCG (2000 UT) CAPS Take by mouth.  . Cinnamon 500 MG capsule   . clobetasol ointment (TEMOVATE) 0.05 % Apply topically.  Marland Kitchen estradiol (ESTRACE) 2 MG tablet 1 tablet  . meloxicam (MOBIC) 15 MG tablet 1 tablet  . mirtazapine (REMERON) 30 MG tablet Take 30 mg by mouth at bedtime.  . Multiple Vitamin (MULTIVITAMIN) tablet Take 1 tablet by mouth daily.     Allergies:   Amoxicillin, Cephalexin, Amoxapine, Tapentadol, and Other   Social History   Socioeconomic History  . Marital status: Married    Spouse name: Not on file  . Number of children: Not on file  . Years of education: Not on file  . Highest education level: Not on file  Occupational History  . Not on file  Tobacco Use  . Smoking status: Current Every Day Smoker    Packs/day: 0.75    Years: 50.00    Pack years: 37.50    Types: Cigarettes  . Smokeless tobacco: Never Used  Substance and Sexual Activity  . Alcohol use: No  . Drug use: No  . Sexual activity: Not Currently    Birth control/protection: Surgical  Other Topics Concern  . Not on file  Social History Narrative  . Not on file  Social Determinants of Health   Financial Resource Strain: Not on file  Food Insecurity: Not on file  Transportation Needs: Not on file  Physical Activity: Not on file  Stress: Not on file  Social Connections: Not on file     Family History: The patient's family history includes Heart attack in her father; Stroke in her mother. There is no history of Breast cancer. ROS:   Please see the history of present illness.    All 14 point review of systems negative except as described per history of present illness  EKGs/Labs/Other Studies Reviewed:      Recent Labs: No results found for requested labs within last 8760 hours.  Recent Lipid  Panel No results found for: CHOL, TRIG, HDL, CHOLHDL, VLDL, LDLCALC, LDLDIRECT  Physical Exam:    VS:  BP (!) 92/50 (BP Location: Right Arm, Patient Position: Sitting)   Pulse 63   Ht 5\' 4"  (1.626 m)   Wt 132 lb (59.9 kg)   SpO2 97%   BMI 22.66 kg/m     Wt Readings from Last 3 Encounters:  12/12/20 132 lb (59.9 kg)  07/02/20 129 lb (58.5 kg)  06/24/20 129 lb (58.5 kg)     GEN:  Well nourished, well developed in no acute distress HEENT: Normal NECK: No JVD; No carotid bruits LYMPHATICS: No lymphadenopathy CARDIAC: RRR, no murmurs, no rubs, no gallops RESPIRATORY:  Clear to auscultation without rales, wheezing or rhonchi  ABDOMEN: Soft, non-tender, non-distended MUSCULOSKELETAL:  No edema; No deformity  SKIN: Warm and dry LOWER EXTREMITIES: no swelling NEUROLOGIC:  Alert and oriented x 3 PSYCHIATRIC:  Normal affect   ASSESSMENT:    1. Palpitations   2. Atypical chest pain   3. Centrilobular emphysema ([place])    PLAN:    In order of problems listed above:  1. 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 2. Atypical chest pain denies having any, stress test done in September showed no evidence of ischemia. 3. Emphysema stable. 4. I will see her back in my office in about 6 months or sooner if she has a problem   Medication Adjustments/Labs and Tests Ordered: Current medicines are reviewed at length with the patient today.  Concerns regarding medicines are outlined above.  No orders of the defined types were placed in this encounter.  Medication changes: No orders of the defined types were placed in this encounter.   Signed, Park Liter, MD, [facility] 12/12/2020 2:11 PM    Danielle Wright

## 2020-12-16 DIAGNOSIS — G5700 Lesion of sciatic nerve, unspecified lower limb: Secondary | ICD-10-CM | POA: Diagnosis not present

## 2020-12-16 DIAGNOSIS — Z79891 Long term (current) use of opiate analgesic: Secondary | ICD-10-CM | POA: Diagnosis not present

## 2020-12-16 DIAGNOSIS — M961 Postlaminectomy syndrome, not elsewhere classified: Secondary | ICD-10-CM | POA: Diagnosis not present

## 2020-12-16 DIAGNOSIS — M47816 Spondylosis without myelopathy or radiculopathy, lumbar region: Secondary | ICD-10-CM | POA: Diagnosis not present

## 2020-12-16 DIAGNOSIS — M461 Sacroiliitis, not elsewhere classified: Secondary | ICD-10-CM | POA: Diagnosis not present

## 2020-12-16 DIAGNOSIS — G894 Chronic pain syndrome: Secondary | ICD-10-CM | POA: Diagnosis not present

## 2020-12-16 DIAGNOSIS — M5136 Other intervertebral disc degeneration, lumbar region: Secondary | ICD-10-CM | POA: Diagnosis not present

## 2021-01-28 DIAGNOSIS — Z1231 Encounter for screening mammogram for malignant neoplasm of breast: Secondary | ICD-10-CM | POA: Diagnosis not present

## 2021-02-17 DIAGNOSIS — M5136 Other intervertebral disc degeneration, lumbar region: Secondary | ICD-10-CM | POA: Diagnosis not present

## 2021-02-17 DIAGNOSIS — Z79891 Long term (current) use of opiate analgesic: Secondary | ICD-10-CM | POA: Diagnosis not present

## 2021-02-17 DIAGNOSIS — M961 Postlaminectomy syndrome, not elsewhere classified: Secondary | ICD-10-CM | POA: Diagnosis not present

## 2021-02-17 DIAGNOSIS — G894 Chronic pain syndrome: Secondary | ICD-10-CM | POA: Diagnosis not present

## 2021-02-17 DIAGNOSIS — M47816 Spondylosis without myelopathy or radiculopathy, lumbar region: Secondary | ICD-10-CM | POA: Diagnosis not present

## 2021-02-17 DIAGNOSIS — G5700 Lesion of sciatic nerve, unspecified lower limb: Secondary | ICD-10-CM | POA: Diagnosis not present

## 2021-02-17 DIAGNOSIS — M461 Sacroiliitis, not elsewhere classified: Secondary | ICD-10-CM | POA: Diagnosis not present

## 2021-03-04 ENCOUNTER — Other Ambulatory Visit: Payer: Self-pay | Admitting: *Deleted

## 2021-03-04 DIAGNOSIS — Z87891 Personal history of nicotine dependence: Secondary | ICD-10-CM

## 2021-03-04 DIAGNOSIS — F17210 Nicotine dependence, cigarettes, uncomplicated: Secondary | ICD-10-CM

## 2021-03-22 DIAGNOSIS — Z20822 Contact with and (suspected) exposure to covid-19: Secondary | ICD-10-CM | POA: Diagnosis not present

## 2021-03-26 DIAGNOSIS — M47816 Spondylosis without myelopathy or radiculopathy, lumbar region: Secondary | ICD-10-CM | POA: Diagnosis not present

## 2021-03-26 DIAGNOSIS — M5136 Other intervertebral disc degeneration, lumbar region: Secondary | ICD-10-CM | POA: Diagnosis not present

## 2021-03-26 DIAGNOSIS — M47817 Spondylosis without myelopathy or radiculopathy, lumbosacral region: Secondary | ICD-10-CM | POA: Diagnosis not present

## 2021-03-26 DIAGNOSIS — Z135 Encounter for screening for eye and ear disorders: Secondary | ICD-10-CM | POA: Diagnosis not present

## 2021-03-26 DIAGNOSIS — M4807 Spinal stenosis, lumbosacral region: Secondary | ICD-10-CM | POA: Diagnosis not present

## 2021-03-26 DIAGNOSIS — M48061 Spinal stenosis, lumbar region without neurogenic claudication: Secondary | ICD-10-CM | POA: Diagnosis not present

## 2021-03-26 DIAGNOSIS — M5137 Other intervertebral disc degeneration, lumbosacral region: Secondary | ICD-10-CM | POA: Diagnosis not present

## 2021-03-26 DIAGNOSIS — Z981 Arthrodesis status: Secondary | ICD-10-CM | POA: Diagnosis not present

## 2021-03-26 DIAGNOSIS — R600 Localized edema: Secondary | ICD-10-CM | POA: Diagnosis not present

## 2021-04-03 DIAGNOSIS — G5601 Carpal tunnel syndrome, right upper limb: Secondary | ICD-10-CM | POA: Diagnosis not present

## 2021-04-03 DIAGNOSIS — G603 Idiopathic progressive neuropathy: Secondary | ICD-10-CM | POA: Diagnosis not present

## 2021-04-03 DIAGNOSIS — M5417 Radiculopathy, lumbosacral region: Secondary | ICD-10-CM | POA: Diagnosis not present

## 2021-04-03 DIAGNOSIS — M5412 Radiculopathy, cervical region: Secondary | ICD-10-CM | POA: Diagnosis not present

## 2021-04-21 DIAGNOSIS — M5136 Other intervertebral disc degeneration, lumbar region: Secondary | ICD-10-CM | POA: Diagnosis not present

## 2021-04-21 DIAGNOSIS — M47816 Spondylosis without myelopathy or radiculopathy, lumbar region: Secondary | ICD-10-CM | POA: Diagnosis not present

## 2021-04-21 DIAGNOSIS — R7303 Prediabetes: Secondary | ICD-10-CM | POA: Diagnosis not present

## 2021-04-21 DIAGNOSIS — G894 Chronic pain syndrome: Secondary | ICD-10-CM | POA: Diagnosis not present

## 2021-04-21 DIAGNOSIS — E7800 Pure hypercholesterolemia, unspecified: Secondary | ICD-10-CM | POA: Diagnosis not present

## 2021-04-21 DIAGNOSIS — Z79891 Long term (current) use of opiate analgesic: Secondary | ICD-10-CM | POA: Diagnosis not present

## 2021-04-21 DIAGNOSIS — E559 Vitamin D deficiency, unspecified: Secondary | ICD-10-CM | POA: Diagnosis not present

## 2021-04-21 DIAGNOSIS — M961 Postlaminectomy syndrome, not elsewhere classified: Secondary | ICD-10-CM | POA: Diagnosis not present

## 2021-05-02 DIAGNOSIS — R7303 Prediabetes: Secondary | ICD-10-CM | POA: Diagnosis not present

## 2021-05-02 DIAGNOSIS — K5903 Drug induced constipation: Secondary | ICD-10-CM | POA: Diagnosis not present

## 2021-05-02 DIAGNOSIS — J432 Centrilobular emphysema: Secondary | ICD-10-CM | POA: Diagnosis not present

## 2021-05-02 DIAGNOSIS — I440 Atrioventricular block, first degree: Secondary | ICD-10-CM | POA: Diagnosis not present

## 2021-05-02 DIAGNOSIS — E559 Vitamin D deficiency, unspecified: Secondary | ICD-10-CM | POA: Diagnosis not present

## 2021-05-02 DIAGNOSIS — M5136 Other intervertebral disc degeneration, lumbar region: Secondary | ICD-10-CM | POA: Diagnosis not present

## 2021-05-02 DIAGNOSIS — E7800 Pure hypercholesterolemia, unspecified: Secondary | ICD-10-CM | POA: Diagnosis not present

## 2021-05-02 DIAGNOSIS — R946 Abnormal results of thyroid function studies: Secondary | ICD-10-CM | POA: Diagnosis not present

## 2021-05-02 DIAGNOSIS — F17200 Nicotine dependence, unspecified, uncomplicated: Secondary | ICD-10-CM | POA: Diagnosis not present

## 2021-05-02 DIAGNOSIS — I444 Left anterior fascicular block: Secondary | ICD-10-CM | POA: Diagnosis not present

## 2021-05-02 DIAGNOSIS — N1831 Chronic kidney disease, stage 3a: Secondary | ICD-10-CM | POA: Diagnosis not present

## 2021-05-06 DIAGNOSIS — M461 Sacroiliitis, not elsewhere classified: Secondary | ICD-10-CM | POA: Diagnosis not present

## 2021-06-17 ENCOUNTER — Other Ambulatory Visit: Payer: Self-pay

## 2021-06-17 ENCOUNTER — Ambulatory Visit (INDEPENDENT_AMBULATORY_CARE_PROVIDER_SITE_OTHER): Payer: Medicare Other | Admitting: Cardiology

## 2021-06-17 VITALS — BP 106/62 | HR 71 | Ht 64.0 in | Wt 130.0 lb

## 2021-06-17 DIAGNOSIS — I471 Supraventricular tachycardia: Secondary | ICD-10-CM

## 2021-06-17 DIAGNOSIS — J432 Centrilobular emphysema: Secondary | ICD-10-CM

## 2021-06-17 DIAGNOSIS — E785 Hyperlipidemia, unspecified: Secondary | ICD-10-CM

## 2021-06-17 DIAGNOSIS — F17200 Nicotine dependence, unspecified, uncomplicated: Secondary | ICD-10-CM | POA: Diagnosis not present

## 2021-06-17 NOTE — Patient Instructions (Signed)

## 2021-06-17 NOTE — Progress Notes (Signed)
Cardiology Office Note:    Date:  06/17/2021   ID:  Danielle Wright, DOB 10/20/1940, MRN [id number]  PCP:  Danielle Morning, DO  Cardiologist:  Danielle Campus, MD    Referring MD: Danielle Morning, DO   Chief Complaint  Patient presents with   Fatigue    Not sleeping well.    History of Present Illness:    Danielle Wright is a 80 y.o. female who was sent to [place] because of palpitations.  She did wear a monitor which showed PVCs, APCs, supraventricular tachycardia.  After that she got quite extensive evaluation done on her heart which included stress test which was negative as well as echocardiogram which showed preserved left ventricle ejection fraction.  She comes today to my office for follow-up overall she seems to be doing well.  She denies have any chest pain tightness squeezing pressure burning chest.  She described very rare palpitations that do not bother her much.  We did discuss options of potential medical treatment however she is not interested.  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  Sadly she still continues to smoke.  Past Medical History:  Diagnosis Date   Atypical chest pain 03/22/2020   Chronic pain syndrome 03/22/2020   Combined forms of age-related cataract of left eye 09/12/2020   Combined forms of age-related cataract of right eye 01/11/8118   Complication of surgical procedure 03/22/2020   Degeneration of lumbar intervertebral disc 03/22/2020   Dyslipidemia 06/24/2020   Dyspnea on exertion 03/22/2020   Emphysema of lung ([place])    Lumbosacral spondylosis without myelopathy 03/22/2020   Palpitations 03/22/2020   Regular astigmatism, bilateral 09/12/2020   Sacroiliitis, not elsewhere classified ([place]) 03/22/2020   Sciatic nerve lesion 03/22/2020   Smoking 06/24/2020   Unexplained weight loss 03/22/2020    Past Surgical History:  Procedure Laterality Date   ABDOMINAL HYSTERECTOMY      BACK SURGERY     FOOT SURGERY Right    SHOULDER SURGERY Right    thumb surgery Right    TOTAL HIP ARTHROPLASTY      Current Medications: Current Meds  Medication Sig   brimonidine (ALPHAGAN) 0.15 % ophthalmic solution Place 1 drop into both eyes 2 (two) times daily.   Calcium Carbonate-Vitamin D (OYSTER SHELL CALCIUM/D) 250-125 MG-UNIT TABS Take 1 tablet by mouth daily.   Cholecalciferol (VITAMIN D) 50 MCG (2000 UT) CAPS Take 1 capsule by mouth daily.   Cinnamon 500 MG capsule Take 500 mg by mouth daily.   clobetasol ointment (TEMOVATE) 1.47 % Apply 1 application topically 2 (two) times daily.   estradiol (ESTRACE) 2 MG tablet Take 2 mg by mouth daily.   meloxicam (MOBIC) 15 MG tablet Take 15 mg by mouth.   mirtazapine (REMERON) 30 MG tablet Take 30 mg by mouth at bedtime.   Multiple Vitamin (MULTIVITAMIN) tablet Take 1 tablet by mouth daily. Unknown strength     Allergies:   Amoxicillin, Cephalexin, Amoxapine, Tapentadol, and Other   Social History   Socioeconomic History   Marital status: Married    Spouse name: Not on file   Number of children: Not on file   Years of education: Not on file   Highest education level: Not on file  Occupational History   Not on file  Tobacco Use   Smoking status: Every Day    Packs/day: 0.75    Years: 50.00    Pack years: 37.50  Types: Cigarettes   Smokeless tobacco: Never  Substance and Sexual Activity   Alcohol use: No   Drug use: No   Sexual activity: Not Currently    Birth control/protection: Surgical  Other Topics Concern   Not on file  Social History Narrative   Not on file   Social Determinants of Health   Financial Resource Strain: Not on file  Food Insecurity: Not on file  Transportation Needs: Not on file  Physical Activity: Not on file  Stress: Not on file  Social Connections: Not on file     Family History: The patient's family history includes Heart attack in her father; Stroke in her mother. There is no  history of Breast cancer. ROS:   Please see the history of present illness.    All 14 point review of systems negative except as described per history of present illness  EKGs/Labs/Other Studies Reviewed:      Recent Labs: No results found for requested labs within last 8760 hours.  Recent Lipid Panel No results found for: CHOL, TRIG, HDL, CHOLHDL, VLDL, LDLCALC, LDLDIRECT  Physical Exam:    VS:  BP 106/62 (BP Location: Right Arm, Patient Position: Sitting)   Pulse 71   Ht 5\' 4"  (1.626 m)   Wt 130 lb (59 kg)   SpO2 94%   BMI 22.31 kg/m     Wt Readings from Last 3 Encounters:  06/17/21 130 lb (59 kg)  12/12/20 132 lb (59.9 kg)  07/02/20 129 lb (58.5 kg)     GEN:  Well nourished, well developed in no acute distress HEENT: Normal NECK: No JVD; No carotid bruits LYMPHATICS: No lymphadenopathy CARDIAC: RRR, no murmurs, no rubs, no gallops RESPIRATORY:  Clear to auscultation without rales, wheezing or rhonchi  ABDOMEN: Soft, non-tender, non-distended MUSCULOSKELETAL:  No edema; No deformity  SKIN: Warm and dry LOWER EXTREMITIES: no swelling NEUROLOGIC:  Alert and oriented x 3 PSYCHIATRIC:  Normal affect   ASSESSMENT:    1. Centrilobular emphysema ([place])   2. SVT (supraventricular tachycardia) ([place])   3. Dyslipidemia   4. Smoking    PLAN:    In order of problems listed above:  Supraventricular tachycardia does not bother her much.  I asked her to let me know if she will start being troubled by that Smoke which is still ongoing and spent at least 10 minutes trying to strongly convince her to quit. Dyslipidemia: I did review K PN which show LDL 109 HDL 48 however that date is from 2019.  She said her physician checked her cholesterol apparently everything is good I will try to get copy of it. 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   Medication Adjustments/Labs and Tests Ordered: Current medicines are reviewed at length with the  patient today.  Concerns regarding medicines are outlined above.  No orders of the defined types were placed in this encounter.  Medication changes: No orders of the defined types were placed in this encounter.   Signed, Park Liter, MD, [facility] 06/17/2021 1:23 PM    [place]

## 2021-08-13 DIAGNOSIS — M5136 Other intervertebral disc degeneration, lumbar region: Secondary | ICD-10-CM | POA: Diagnosis not present

## 2021-08-13 DIAGNOSIS — G894 Chronic pain syndrome: Secondary | ICD-10-CM | POA: Diagnosis not present

## 2021-08-13 DIAGNOSIS — M47816 Spondylosis without myelopathy or radiculopathy, lumbar region: Secondary | ICD-10-CM | POA: Diagnosis not present

## 2021-08-13 DIAGNOSIS — M791 Myalgia, unspecified site: Secondary | ICD-10-CM | POA: Diagnosis not present

## 2021-08-13 DIAGNOSIS — Z79891 Long term (current) use of opiate analgesic: Secondary | ICD-10-CM | POA: Diagnosis not present

## 2021-08-13 DIAGNOSIS — M961 Postlaminectomy syndrome, not elsewhere classified: Secondary | ICD-10-CM | POA: Diagnosis not present

## 2021-08-26 DIAGNOSIS — L80 Vitiligo: Secondary | ICD-10-CM | POA: Diagnosis not present

## 2021-09-12 DIAGNOSIS — Z23 Encounter for immunization: Secondary | ICD-10-CM | POA: Diagnosis not present

## 2021-09-18 DIAGNOSIS — G603 Idiopathic progressive neuropathy: Secondary | ICD-10-CM | POA: Diagnosis not present

## 2021-09-18 DIAGNOSIS — M5412 Radiculopathy, cervical region: Secondary | ICD-10-CM | POA: Diagnosis not present

## 2021-09-18 DIAGNOSIS — M5417 Radiculopathy, lumbosacral region: Secondary | ICD-10-CM | POA: Diagnosis not present

## 2021-10-17 DIAGNOSIS — G894 Chronic pain syndrome: Secondary | ICD-10-CM | POA: Diagnosis not present

## 2021-10-17 DIAGNOSIS — Z79891 Long term (current) use of opiate analgesic: Secondary | ICD-10-CM | POA: Diagnosis not present

## 2021-10-17 DIAGNOSIS — M5136 Other intervertebral disc degeneration, lumbar region: Secondary | ICD-10-CM | POA: Diagnosis not present

## 2021-10-17 DIAGNOSIS — M47816 Spondylosis without myelopathy or radiculopathy, lumbar region: Secondary | ICD-10-CM | POA: Diagnosis not present

## 2021-10-17 DIAGNOSIS — M791 Myalgia, unspecified site: Secondary | ICD-10-CM | POA: Diagnosis not present

## 2021-10-17 DIAGNOSIS — M961 Postlaminectomy syndrome, not elsewhere classified: Secondary | ICD-10-CM | POA: Diagnosis not present

## 2021-10-31 DIAGNOSIS — G894 Chronic pain syndrome: Secondary | ICD-10-CM | POA: Diagnosis not present

## 2021-10-31 DIAGNOSIS — M791 Myalgia, unspecified site: Secondary | ICD-10-CM | POA: Diagnosis not present

## 2021-11-04 DIAGNOSIS — R7303 Prediabetes: Secondary | ICD-10-CM | POA: Diagnosis not present

## 2021-11-04 DIAGNOSIS — R946 Abnormal results of thyroid function studies: Secondary | ICD-10-CM | POA: Diagnosis not present

## 2021-11-04 DIAGNOSIS — I440 Atrioventricular block, first degree: Secondary | ICD-10-CM | POA: Diagnosis not present

## 2021-11-04 DIAGNOSIS — N1831 Chronic kidney disease, stage 3a: Secondary | ICD-10-CM | POA: Diagnosis not present

## 2021-11-04 DIAGNOSIS — E7800 Pure hypercholesterolemia, unspecified: Secondary | ICD-10-CM | POA: Diagnosis not present

## 2021-11-11 DIAGNOSIS — M5136 Other intervertebral disc degeneration, lumbar region: Secondary | ICD-10-CM | POA: Diagnosis not present

## 2021-11-11 DIAGNOSIS — I444 Left anterior fascicular block: Secondary | ICD-10-CM | POA: Diagnosis not present

## 2021-11-11 DIAGNOSIS — Z0000 Encounter for general adult medical examination without abnormal findings: Secondary | ICD-10-CM | POA: Diagnosis not present

## 2021-11-11 DIAGNOSIS — R7303 Prediabetes: Secondary | ICD-10-CM | POA: Diagnosis not present

## 2021-11-11 DIAGNOSIS — N1831 Chronic kidney disease, stage 3a: Secondary | ICD-10-CM | POA: Diagnosis not present

## 2021-11-11 DIAGNOSIS — E7800 Pure hypercholesterolemia, unspecified: Secondary | ICD-10-CM | POA: Diagnosis not present

## 2021-11-11 DIAGNOSIS — N390 Urinary tract infection, site not specified: Secondary | ICD-10-CM | POA: Diagnosis not present

## 2021-11-11 DIAGNOSIS — J432 Centrilobular emphysema: Secondary | ICD-10-CM | POA: Diagnosis not present

## 2021-11-11 DIAGNOSIS — F17200 Nicotine dependence, unspecified, uncomplicated: Secondary | ICD-10-CM | POA: Diagnosis not present

## 2021-11-11 DIAGNOSIS — I471 Supraventricular tachycardia: Secondary | ICD-10-CM | POA: Diagnosis not present

## 2021-11-11 DIAGNOSIS — L80 Vitiligo: Secondary | ICD-10-CM | POA: Diagnosis not present

## 2021-11-11 DIAGNOSIS — K5903 Drug induced constipation: Secondary | ICD-10-CM | POA: Diagnosis not present

## 2021-12-22 DIAGNOSIS — M961 Postlaminectomy syndrome, not elsewhere classified: Secondary | ICD-10-CM | POA: Diagnosis not present

## 2021-12-22 DIAGNOSIS — M5136 Other intervertebral disc degeneration, lumbar region: Secondary | ICD-10-CM | POA: Diagnosis not present

## 2021-12-22 DIAGNOSIS — Z79891 Long term (current) use of opiate analgesic: Secondary | ICD-10-CM | POA: Diagnosis not present

## 2021-12-22 DIAGNOSIS — M47816 Spondylosis without myelopathy or radiculopathy, lumbar region: Secondary | ICD-10-CM | POA: Diagnosis not present

## 2021-12-22 DIAGNOSIS — G894 Chronic pain syndrome: Secondary | ICD-10-CM | POA: Diagnosis not present

## 2022-01-13 DIAGNOSIS — G894 Chronic pain syndrome: Secondary | ICD-10-CM | POA: Diagnosis not present

## 2022-01-13 DIAGNOSIS — Z79891 Long term (current) use of opiate analgesic: Secondary | ICD-10-CM | POA: Diagnosis not present

## 2022-01-22 DIAGNOSIS — U071 COVID-19: Secondary | ICD-10-CM | POA: Diagnosis not present

## 2022-02-23 DIAGNOSIS — G894 Chronic pain syndrome: Secondary | ICD-10-CM | POA: Diagnosis not present

## 2022-02-23 DIAGNOSIS — M961 Postlaminectomy syndrome, not elsewhere classified: Secondary | ICD-10-CM | POA: Diagnosis not present

## 2022-02-23 DIAGNOSIS — Z1231 Encounter for screening mammogram for malignant neoplasm of breast: Secondary | ICD-10-CM | POA: Diagnosis not present

## 2022-02-23 DIAGNOSIS — M47816 Spondylosis without myelopathy or radiculopathy, lumbar region: Secondary | ICD-10-CM | POA: Diagnosis not present

## 2022-02-23 DIAGNOSIS — M5136 Other intervertebral disc degeneration, lumbar region: Secondary | ICD-10-CM | POA: Diagnosis not present

## 2022-02-23 DIAGNOSIS — Z79891 Long term (current) use of opiate analgesic: Secondary | ICD-10-CM | POA: Diagnosis not present

## 2022-03-03 DIAGNOSIS — R922 Inconclusive mammogram: Secondary | ICD-10-CM | POA: Diagnosis not present

## 2022-03-03 DIAGNOSIS — R928 Other abnormal and inconclusive findings on diagnostic imaging of breast: Secondary | ICD-10-CM | POA: Diagnosis not present

## 2022-03-12 DIAGNOSIS — H9201 Otalgia, right ear: Secondary | ICD-10-CM | POA: Diagnosis not present

## 2022-03-12 DIAGNOSIS — H6120 Impacted cerumen, unspecified ear: Secondary | ICD-10-CM | POA: Diagnosis not present

## 2022-03-12 DIAGNOSIS — R6884 Jaw pain: Secondary | ICD-10-CM | POA: Diagnosis not present

## 2022-03-19 DIAGNOSIS — G603 Idiopathic progressive neuropathy: Secondary | ICD-10-CM | POA: Diagnosis not present

## 2022-03-19 DIAGNOSIS — M5417 Radiculopathy, lumbosacral region: Secondary | ICD-10-CM | POA: Diagnosis not present

## 2022-03-19 DIAGNOSIS — M5412 Radiculopathy, cervical region: Secondary | ICD-10-CM | POA: Diagnosis not present

## 2022-04-14 DIAGNOSIS — M5136 Other intervertebral disc degeneration, lumbar region: Secondary | ICD-10-CM | POA: Diagnosis not present

## 2022-04-14 DIAGNOSIS — M47816 Spondylosis without myelopathy or radiculopathy, lumbar region: Secondary | ICD-10-CM | POA: Diagnosis not present

## 2022-04-14 DIAGNOSIS — M961 Postlaminectomy syndrome, not elsewhere classified: Secondary | ICD-10-CM | POA: Diagnosis not present

## 2022-04-14 DIAGNOSIS — Z79891 Long term (current) use of opiate analgesic: Secondary | ICD-10-CM | POA: Diagnosis not present

## 2022-04-14 DIAGNOSIS — G894 Chronic pain syndrome: Secondary | ICD-10-CM | POA: Diagnosis not present

## 2022-05-07 DIAGNOSIS — R7303 Prediabetes: Secondary | ICD-10-CM | POA: Diagnosis not present

## 2022-05-07 DIAGNOSIS — E7800 Pure hypercholesterolemia, unspecified: Secondary | ICD-10-CM | POA: Diagnosis not present

## 2022-05-07 DIAGNOSIS — N390 Urinary tract infection, site not specified: Secondary | ICD-10-CM | POA: Diagnosis not present

## 2022-05-07 DIAGNOSIS — N1831 Chronic kidney disease, stage 3a: Secondary | ICD-10-CM | POA: Diagnosis not present

## 2022-06-22 DIAGNOSIS — Z79891 Long term (current) use of opiate analgesic: Secondary | ICD-10-CM | POA: Diagnosis not present

## 2022-06-22 DIAGNOSIS — M5136 Other intervertebral disc degeneration, lumbar region: Secondary | ICD-10-CM | POA: Diagnosis not present

## 2022-06-22 DIAGNOSIS — M47816 Spondylosis without myelopathy or radiculopathy, lumbar region: Secondary | ICD-10-CM | POA: Diagnosis not present

## 2022-06-22 DIAGNOSIS — G894 Chronic pain syndrome: Secondary | ICD-10-CM | POA: Diagnosis not present

## 2022-06-22 DIAGNOSIS — M961 Postlaminectomy syndrome, not elsewhere classified: Secondary | ICD-10-CM | POA: Diagnosis not present

## 2022-06-24 DIAGNOSIS — R519 Headache, unspecified: Secondary | ICD-10-CM | POA: Diagnosis not present

## 2022-07-06 ENCOUNTER — Other Ambulatory Visit (HOSPITAL_COMMUNITY): Payer: Self-pay | Admitting: Family Medicine

## 2022-07-06 ENCOUNTER — Other Ambulatory Visit: Payer: Self-pay | Admitting: Family Medicine

## 2022-07-06 DIAGNOSIS — R519 Headache, unspecified: Secondary | ICD-10-CM

## 2022-07-12 ENCOUNTER — Ambulatory Visit (HOSPITAL_COMMUNITY)
Admission: RE | Admit: 2022-07-12 | Discharge: 2022-07-12 | Disposition: A | Discharge status: Home / Self Care | Payer: Medicare Other | Source: Ambulatory Visit | Attending: Family Medicine | Admitting: Family Medicine

## 2022-07-12 DIAGNOSIS — R519 Headache, unspecified: Secondary | ICD-10-CM | POA: Diagnosis not present

## 2022-07-12 MED ORDER — GADOPICLENOL 0.5 MMOL/ML IV SOLN
6.0000 mL | Freq: Once | INTRAVENOUS | Status: AC | PRN
  Administered 2022-07-12: 6 mL via INTRAVENOUS

## 2022-08-19 DIAGNOSIS — G894 Chronic pain syndrome: Secondary | ICD-10-CM | POA: Diagnosis not present

## 2022-08-19 DIAGNOSIS — M961 Postlaminectomy syndrome, not elsewhere classified: Secondary | ICD-10-CM | POA: Diagnosis not present

## 2022-08-19 DIAGNOSIS — M47816 Spondylosis without myelopathy or radiculopathy, lumbar region: Secondary | ICD-10-CM | POA: Diagnosis not present

## 2022-08-19 DIAGNOSIS — M5136 Other intervertebral disc degeneration, lumbar region: Secondary | ICD-10-CM | POA: Diagnosis not present

## 2022-08-19 DIAGNOSIS — Z79891 Long term (current) use of opiate analgesic: Secondary | ICD-10-CM | POA: Diagnosis not present

## 2022-08-25 DIAGNOSIS — L80 Vitiligo: Secondary | ICD-10-CM | POA: Diagnosis not present

## 2022-09-01 DIAGNOSIS — R300 Dysuria: Secondary | ICD-10-CM | POA: Diagnosis not present

## 2022-09-08 ENCOUNTER — Ambulatory Visit: Payer: Medicare Other | Attending: Cardiology | Admitting: Cardiology
